# Patient Record
Sex: Female | Born: 1960 | Race: White | Hispanic: No | Marital: Single | Smoking: Current every day smoker
Health system: Southern US, Community
[De-identification: ages and names within clinical notes are randomized; demographics above are authoritative.]

## PROBLEM LIST (undated history)

## (undated) DIAGNOSIS — I10 Essential (primary) hypertension: Secondary | ICD-10-CM

## (undated) DIAGNOSIS — K759 Inflammatory liver disease, unspecified: Secondary | ICD-10-CM

## (undated) DIAGNOSIS — F419 Anxiety disorder, unspecified: Secondary | ICD-10-CM

## (undated) DIAGNOSIS — J449 Chronic obstructive pulmonary disease, unspecified: Secondary | ICD-10-CM

## (undated) HISTORY — PX: DILATION AND CURETTAGE OF UTERUS: SHX78

---

## 2004-12-04 ENCOUNTER — Emergency Department: Payer: Self-pay | Admitting: Emergency Medicine

## 2009-04-28 ENCOUNTER — Emergency Department: Payer: Self-pay | Admitting: Emergency Medicine

## 2012-12-06 ENCOUNTER — Emergency Department: Payer: Self-pay | Admitting: Emergency Medicine

## 2012-12-06 LAB — URINALYSIS, COMPLETE
Bacteria: NONE SEEN
Bilirubin,UR: NEGATIVE
Ketone: NEGATIVE
Leukocyte Esterase: NEGATIVE
Nitrite: NEGATIVE
Protein: NEGATIVE
RBC,UR: <1 /HPF (ref 0–5)
WBC UR: NONE SEEN /HPF (ref 0–5)

## 2013-07-13 ENCOUNTER — Emergency Department: Payer: Self-pay | Admitting: Emergency Medicine

## 2014-01-05 ENCOUNTER — Ambulatory Visit: Payer: Self-pay

## 2014-03-15 ENCOUNTER — Emergency Department: Payer: Self-pay | Admitting: Internal Medicine

## 2014-03-15 ENCOUNTER — Ambulatory Visit: Payer: Self-pay

## 2014-03-24 ENCOUNTER — Emergency Department: Payer: Self-pay | Admitting: Emergency Medicine

## 2014-08-31 ENCOUNTER — Emergency Department: Payer: Self-pay | Admitting: Emergency Medicine

## 2014-08-31 LAB — DRUG SCREEN, URINE
Amphetamines, Ur Screen: NEGATIVE (ref ?–1000)
Barbiturates, Ur Screen: NEGATIVE (ref ?–200)
Benzodiazepine, Ur Scrn: POSITIVE (ref ?–200)
CANNABINOID 50 NG, UR ~~LOC~~: NEGATIVE (ref ?–50)
Cocaine Metabolite,Ur ~~LOC~~: POSITIVE (ref ?–300)
MDMA (Ecstasy)Ur Screen: NEGATIVE (ref ?–500)
METHADONE, UR SCREEN: NEGATIVE (ref ?–300)
Opiate, Ur Screen: POSITIVE (ref ?–300)
PHENCYCLIDINE (PCP) UR S: NEGATIVE (ref ?–25)
TRICYCLIC, UR SCREEN: NEGATIVE (ref ?–1000)

## 2014-08-31 LAB — SALICYLATE LEVEL: Salicylates, Serum: 4 mg/dL — ABNORMAL HIGH

## 2014-08-31 LAB — ETHANOL: Ethanol: <3 mg/dL

## 2014-08-31 LAB — COMPREHENSIVE METABOLIC PANEL
ALBUMIN: 3.8 g/dL (ref 3.4–5.0)
ALT: 34 U/L
AST: 34 U/L (ref 15–37)
Alkaline Phosphatase: 79 U/L
Anion Gap: 8 (ref 7–16)
BUN: 6 mg/dL — ABNORMAL LOW (ref 7–18)
Bilirubin,Total: 0.5 mg/dL (ref 0.2–1.0)
CALCIUM: 8.4 mg/dL — AB (ref 8.5–10.1)
CHLORIDE: 106 mmol/L (ref 98–107)
Co2: 27 mmol/L (ref 21–32)
Creatinine: 0.7 mg/dL (ref 0.60–1.30)
Glucose: 114 mg/dL — ABNORMAL HIGH (ref 65–99)
OSMOLALITY: 280 (ref 275–301)
Potassium: 4 mmol/L (ref 3.5–5.1)
Sodium: 141 mmol/L (ref 136–145)
Total Protein: 8.2 g/dL (ref 6.4–8.2)

## 2014-08-31 LAB — CBC
HCT: 46.1 % (ref 35.0–47.0)
HGB: 15.6 g/dL (ref 12.0–16.0)
MCH: 31 pg (ref 26.0–34.0)
MCHC: 33.8 g/dL (ref 32.0–36.0)
MCV: 92 fL (ref 80–100)
PLATELETS: 334 10*3/uL (ref 150–440)
RBC: 5.03 10*6/uL (ref 3.80–5.20)
RDW: 12.1 % (ref 11.5–14.5)
WBC: 11.6 10*3/uL — AB (ref 3.6–11.0)

## 2014-08-31 LAB — TSH: Thyroid Stimulating Horm: 0.192 u[IU]/mL — ABNORMAL LOW

## 2014-08-31 LAB — ACETAMINOPHEN LEVEL: Acetaminophen: <2 ug/mL

## 2014-09-15 ENCOUNTER — Emergency Department: Payer: Self-pay | Admitting: Emergency Medicine

## 2014-10-30 ENCOUNTER — Emergency Department: Payer: Self-pay | Admitting: Emergency Medicine

## 2014-11-03 ENCOUNTER — Emergency Department: Payer: Self-pay | Admitting: Emergency Medicine

## 2015-04-30 ENCOUNTER — Emergency Department
Admission: EM | Admit: 2015-04-30 | Discharge: 2015-04-30 | Disposition: A | Discharge status: Home / Self Care | Payer: Self-pay | Attending: Emergency Medicine | Admitting: Emergency Medicine

## 2015-04-30 ENCOUNTER — Emergency Department: Payer: Self-pay

## 2015-04-30 DIAGNOSIS — M25511 Pain in right shoulder: Secondary | ICD-10-CM | POA: Insufficient documentation

## 2015-04-30 DIAGNOSIS — Z880 Allergy status to penicillin: Secondary | ICD-10-CM | POA: Insufficient documentation

## 2015-04-30 MED ORDER — IBUPROFEN 800 MG PO TABS
800.0000 mg | ORAL_TABLET | Freq: Once | ORAL | Status: AC
  Administered 2015-04-30: 800 mg via ORAL
  Filled 2015-04-30: qty 1

## 2015-04-30 MED ORDER — ACETAMINOPHEN 500 MG PO TABS
1000.0000 mg | ORAL_TABLET | Freq: Once | ORAL | Status: AC
  Administered 2015-04-30: 1000 mg via ORAL
  Filled 2015-04-30: qty 2

## 2015-04-30 NOTE — Discharge Instructions (Signed)
Arthralgia Arthralgia is joint pain. A joint is a place where two bones meet. Joint pain can happen for many reasons. The joint can be bruised, stiff, infected, or weak from aging. Pain usually goes away after resting and taking medicine for soreness.  HOME CARE  Rest the joint as told by your doctor.  Keep the sore joint raised (elevated) for the first 24 hours.  Put ice on the joint area.  Put ice in a plastic bag.  Place a towel between your skin and the bag.  Leave the ice on for 15-20 minutes, 03-04 times a day.  Wear your splint, casting, elastic bandage, or sling as told by your doctor.  Only take medicine as told by your doctor. Do not take aspirin.  Use crutches as told by your doctor. Do not put weight on the joint until told to by your doctor. GET HELP RIGHT AWAY IF:   You have bruising, puffiness (swelling), or more pain.  Your fingers or toes turn blue or start to lose feeling (numb).  Your medicine does not lessen the pain.  Your pain becomes severe.  You have a temperature by mouth above 102 F (38.9 C), not controlled by medicine.  You cannot move or use the joint. MAKE SURE YOU:   Understand these instructions.  Will watch your condition.  Will get help right away if you are not doing well or get worse. Document Released: 08/07/2009 Document Revised: 11/11/2011 Document Reviewed: 08/07/2009 [facility] Patient Information 2015 [place], [place]. This information is not intended to replace advice given to you by your health care provider. Make sure you discuss any questions you have with your health care provider.  Your shoulder looks okay at present. There may be a little bit of damage to the rotator cuff. But the x-rays show no fracture. Please use Tylenol for pain. Please follow-up with the orthopedic doctor either years at [facility]. Can try Dr. Hyacinth Meeker here in [place] return as needed

## 2015-04-30 NOTE — ED Notes (Signed)
PT STATES: [facility] HAS RIGHT SHOULDER IMAGES

## 2015-04-30 NOTE — ED Notes (Signed)
Pt noted to be laying curled up in a chair, laying on her right side, in the lobby; eyes closed, resp even and nonlabored

## 2015-04-30 NOTE — ED Notes (Addendum)
Pt says she's been having pain to her right shoulder since March; says she had fallen but didn't realize she had broken her shoulder; pt says she is to have a procedure at [facility] in September to "rebreak" the shoulder; pt very talkative in triage

## 2015-04-30 NOTE — ED Notes (Signed)
Patient transported to X-ray 

## 2015-04-30 NOTE — ED Provider Notes (Signed)
[facility] Emergency Department Provider Note  ____________________________________________  Time seen: Approximately 4:11 AM  I have reviewed the triage vital signs and the nursing notes.   HISTORY  Chief Complaint Shoulder Pain    HPI Cassandra Jennings is a 54 y.o. female patient complains of approximately one month pain in the right shoulder. Patient apparently thought she had fractured it. She called [facility] and they "scared her". X-ray here shows no fracture. Patient moves her shoulder well. There is no swelling or deformity. I can see. Sensation says her shoulder works okay. He is asking for something stronger than Tylenol. I explained to her that I cannot really give her anything more than Tylenol or Motrin at present and certainly no prescription for anything stronger than that for pain it's been going on for over a month. She seems to be accepting of that. I should add that there is no limitation of range of motion and that there is no radiation of the pain there is no color change in the hand or history of any of the above.   No past medical history on file. patient reports she has asthma  There are no active problems to display for this patient.   No past surgical history on file.  No current outpatient prescriptions on file.  Allergies Penicillins  No family history on file.  Social History Social History  Substance Use Topics  . Smoking status: Not on file  . Smokeless tobacco: Not on file  . Alcohol Use: Not on file   patient smokes  Review of Systems Constitutional: No fever/chills Eyes: No visual changes. ENT: No sore throat.  Genitourinary: Negative for dysuria. Musculoskeletal: Negative for back pain. Skin: Negative for rash. Neurological: Negative for headaches, focal weakness or numbness.   10-point ROS otherwise negative.  ____________________________________________   PHYSICAL EXAM:  VITAL SIGNS: ED Triage Vitals   Enc Vitals Group     BP 04/30/15 0056 152/83 mmHg     Pulse Rate 04/30/15 0056 55     Resp 04/30/15 0056 18     Temp 04/30/15 0056 97.6 F (36.4 C)     Temp src --      SpO2 04/30/15 0056 99 %     Weight 04/30/15 0056 108 lb (48.988 kg)     Height 04/30/15 0056 5\' 3"  (1.6 m)     Head Cir --      Peak Flow --      Pain Score 04/30/15 0058 7     Pain Loc --      Pain Edu? --      Excl. in GC? --    Constitutional: Alert and oriented. Well appearing and in no acute distress. Eyes: Conjunctivae are normal. PERRL. EOMI. Head: Atraumatic. Mouth/Throat: Mucous membranes are moist.  Oropharynx non-erythematous. Neck: No stridor.Cardiovascular: Normal rate, regular rhythm. Grossly normal heart sounds.  Good peripheral circulation. Musculoskeletal: No lower extremity tenderness nor edema.  No joint effusions up her extremities normal equal pulses normal capillary refill the right shoulder is not tender to palpation there is no effusion there is full range of motion of the shoulder and good strength Neurologic:  Normal speech and language. No gross focal neurologic deficits are appreciated. Skin:  Skin is warm, dry and intact. No rash noted. Psychiatric: Mood and affect are normal. Speech and behavior are normal.  ____________________________________________   LABS (all labs ordered are listed, but only abnormal results are displayed)  Labs Reviewed - No data to  display ____________________________________________  EKG   ____________________________________________  RADIOLOGY  X-ray read by radiology as no fracture no acute disease or may be some bony changes consistent with rotator cuff injury in the computer will not allow me to view the x-rays ____________________________________________   PROCEDURES   ____________________________________________   INITIAL IMPRESSION / ASSESSMENT AND PLAN / ED COURSE  Pertinent labs & imaging results that were available during my care  of the patient were reviewed by me and considered in my medical decision making (see chart for details).   ____________________________________________   FINAL CLINICAL IMPRESSION(S) / ED DIAGNOSES  Final diagnoses:  Shoulder pain, right      Arnaldo Natal, MD 04/30/15 [phone]

## 2015-08-12 ENCOUNTER — Emergency Department
Admission: EM | Admit: 2015-08-12 | Discharge: 2015-08-12 | Disposition: A | Discharge status: Home / Self Care | Payer: Self-pay | Attending: Emergency Medicine | Admitting: Emergency Medicine

## 2015-08-12 ENCOUNTER — Encounter: Payer: Self-pay | Admitting: Emergency Medicine

## 2015-08-12 DIAGNOSIS — F17200 Nicotine dependence, unspecified, uncomplicated: Secondary | ICD-10-CM | POA: Insufficient documentation

## 2015-08-12 DIAGNOSIS — I10 Essential (primary) hypertension: Secondary | ICD-10-CM | POA: Insufficient documentation

## 2015-08-12 DIAGNOSIS — L03115 Cellulitis of right lower limb: Secondary | ICD-10-CM | POA: Insufficient documentation

## 2015-08-12 DIAGNOSIS — Z880 Allergy status to penicillin: Secondary | ICD-10-CM | POA: Insufficient documentation

## 2015-08-12 HISTORY — DX: Anxiety disorder, unspecified: F41.9

## 2015-08-12 HISTORY — DX: Chronic obstructive pulmonary disease, unspecified: J44.9

## 2015-08-12 HISTORY — DX: Essential (primary) hypertension: I10

## 2015-08-12 MED ORDER — TRAMADOL HCL 50 MG PO TABS
50.0000 mg | ORAL_TABLET | Freq: Once | ORAL | Status: AC
  Administered 2015-08-12: 50 mg via ORAL
  Filled 2015-08-12: qty 1

## 2015-08-12 MED ORDER — SULFAMETHOXAZOLE-TRIMETHOPRIM 800-160 MG PO TABS: 2.0000 | ORAL_TABLET | Freq: Two times a day (BID) | ORAL | Status: DC

## 2015-08-12 MED ORDER — SULFAMETHOXAZOLE-TRIMETHOPRIM 800-160 MG PO TABS
1.0000 | ORAL_TABLET | Freq: Once | ORAL | Status: AC
  Administered 2015-08-12: 1 via ORAL
  Filled 2015-08-12: qty 1

## 2015-08-12 NOTE — ED Notes (Signed)
Pt reports spider bite to RLE.  Redness noted.  Pt states "i tried to put a piece of apple on it."

## 2015-08-12 NOTE — ED Notes (Signed)
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

## 2015-08-12 NOTE — Discharge Instructions (Signed)
Cellulitis Cellulitis is an infection of the skin and the tissue beneath it. The infected area is usually red and tender. Cellulitis occurs most often in the arms and lower legs.  CAUSES  Cellulitis is caused by bacteria that enter the skin through cracks or cuts in the skin. The most common types of bacteria that cause cellulitis are staphylococci and streptococci. SIGNS AND SYMPTOMS   Redness and warmth.  Swelling.  Tenderness or pain.  Fever. DIAGNOSIS  Your health care provider can usually determine what is wrong based on a physical exam. Blood tests may also be done. TREATMENT  Treatment usually involves taking an antibiotic medicine. HOME CARE INSTRUCTIONS   Take your antibiotic medicine as directed by your health care provider. Finish the antibiotic even if you start to feel better.  Keep the infected arm or leg elevated to reduce swelling.  Apply a warm cloth to the affected area up to 4 times per day to relieve pain.  Take medicines only as directed by your health care provider.  Keep all follow-up visits as directed by your health care provider. SEEK MEDICAL CARE IF:   You notice red streaks coming from the infected area.  Your red area gets larger or turns dark in color.  Your bone or joint underneath the infected area becomes painful after the skin has healed.  Your infection returns in the same area or another area.  You notice a swollen bump in the infected area.  You develop new symptoms.  You have a fever. SEEK IMMEDIATE MEDICAL CARE IF:   You feel very sleepy.  You develop vomiting or diarrhea.  You have a general ill feeling (malaise) with muscle aches and pains.   This information is not intended to replace advice given to you by your health care provider. Make sure you discuss any questions you have with your health care provider.   Document Released: 05/29/2005 Document Revised: 05/10/2015 Document Reviewed: 11/04/2011 Elsevier Interactive  Patient Education 2016 [employer]  Rest with the leg elevated. Apply warm compresses to promote healing. Take the antibiotic as directed until completed.  [facility].

## 2015-08-12 NOTE — ED Provider Notes (Signed)
[facility] Emergency Department Provider Note ____________________________________________  Time seen: 2036  I have reviewed the triage vital signs and the nursing notes.  HISTORY  Chief Complaint  "spider bite"   HPI Cassandra Jennings is a 54 y.o. female ports to the ED for evaluation of a bump to the right lower extremity from about 3 days ago. She also notes some migration to the area as well. She is under the impression she may have had an insect bite to the area. She does report a remote history of MRSA to the scalp about a year prior. She denies any fevers, chills, or sweats.  Past Medical History  Diagnosis Date  . COPD (chronic obstructive pulmonary disease) (HCC)   . Anxiety   . Hypertension    There are no active problems to display for this patient.  History reviewed. No pertinent past surgical history.  Current Outpatient Rx  Name  Route  Sig  Dispense  Refill  . sulfamethoxazole-trimethoprim (BACTRIM DS,SEPTRA DS) 800-160 MG tablet   Oral   Take 2 tablets by mouth 2 (two) times daily.   40 tablet   0    Allergies Penicillins  History reviewed. No pertinent family history.  Social History Social History  Substance Use Topics  . Smoking status: Current Every Day Smoker  . Smokeless tobacco: None  . Alcohol Use: No   Review of Systems  Constitutional: Negative for fever. Eyes: Negative for visual changes. ENT: Negative for sore throat. Cardiovascular: Negative for chest pain. Respiratory: Negative for shortness of breath. Gastrointestinal: Negative for abdominal pain, vomiting and diarrhea. Genitourinary: Negative for dysuria. Musculoskeletal: Negative for back pain. Skin: Negative for rash. Right lotion any lesion as above. Neurological: Negative for headaches, focal weakness or numbness. ____________________________________________  PHYSICAL EXAM:  VITAL SIGNS: ED Triage Vitals  Enc Vitals Group     BP 08/12/15 1959  139/65 mmHg     Pulse Rate 08/12/15 1959 70     Resp 08/12/15 1959 18     Temp 08/12/15 1959 98 F (36.7 C)     Temp Source 08/12/15 1959 Oral     SpO2 08/12/15 1959 98 %     Weight 08/12/15 1959 113 lb (51.256 kg)     Height 08/12/15 1959 5\' 2"  (1.575 m)     Head Cir --      Peak Flow --      Pain Score 08/12/15 1957 8     Pain Loc --      Pain Edu? --      Excl. in GC? --    Constitutional: Alert and oriented. Well appearing and in no distress. Head: Normocephalic and atraumatic.      Eyes: Conjunctivae are normal. PERRL. Normal extraocular movements      Ears: Canals clear. TMs intact bilaterally.   Nose: No congestion/rhinorrhea.   Mouth/Throat: Mucous membranes are moist.   Neck: Supple. No thyromegaly. Hematological/Lymphatic/Immunological: No cervical lymphadenopathy. Cardiovascular: Normal rate, regular rhythm.  Respiratory: Normal respiratory effort. No wheezes/rales/rhonchi. Gastrointestinal: Soft and nontender. No distention. Musculoskeletal: Nontender with normal range of motion in all extremities.  Neurologic:  Normal gait without ataxia. Normal speech and language. No gross focal neurologic deficits are appreciated. Skin:  Skin is warm, dry and intact. No rash noted. Right anterior shin with a well-demarcated area of erythema with a central scab noted. No lymphangitis, phlebitis or purulent drainage is appreciated. Psychiatric: Mood and affect are normal. Patient exhibits appropriate insight and judgment. ____________________________________________  PROCEDURES  Bactrim DS PO Ultram 50 mg PO ____________________________________________  INITIAL IMPRESSION / ASSESSMENT AND PLAN / ED COURSE  Patient with a right lower extremity superficial nonpurulent cellulitis. She will be discharged with a prescription for Bactrim to dose as directed. She is also to rest with the leg elevated and apply warm compresses to promote healing. She'll follow-up with her  primary care provider at [facility] as needed. Return to the ED for acutely worsening symptoms. ____________________________________________  FINAL CLINICAL IMPRESSION(S) / ED DIAGNOSES  Final diagnoses:  Cellulitis of right lower extremity       Lissa Hoard, PA-C 08/12/15 1610  Jennye Moccasin, MD 08/12/15 2219

## 2015-08-12 NOTE — ED Notes (Signed)
Pt asked if she could have a percocet to take to "feel good for a while." Pt told she is being given a tramadol for pain. Pt appears to be very sleepy; denies any alcohol or drug use today.

## 2015-08-12 NOTE — ED Notes (Signed)
Pt. States she first noticed bump to RLE three days ago.  Pt. Has red swollen bump to posterior of RLE.

## 2015-08-12 NOTE — ED Notes (Addendum)
Pt discharged home after verbalizing understanding of discharge instructions; nad noted. Pt has a ride home.

## 2015-12-04 ENCOUNTER — Emergency Department
Admission: EM | Admit: 2015-12-04 | Discharge: 2015-12-04 | Disposition: A | Discharge status: Home / Self Care | Payer: Self-pay | Attending: Emergency Medicine | Admitting: Emergency Medicine

## 2015-12-04 DIAGNOSIS — I10 Essential (primary) hypertension: Secondary | ICD-10-CM | POA: Insufficient documentation

## 2015-12-04 DIAGNOSIS — R52 Pain, unspecified: Secondary | ICD-10-CM | POA: Insufficient documentation

## 2015-12-04 DIAGNOSIS — J449 Chronic obstructive pulmonary disease, unspecified: Secondary | ICD-10-CM | POA: Insufficient documentation

## 2015-12-04 DIAGNOSIS — F17200 Nicotine dependence, unspecified, uncomplicated: Secondary | ICD-10-CM | POA: Insufficient documentation

## 2015-12-04 DIAGNOSIS — H66001 Acute suppurative otitis media without spontaneous rupture of ear drum, right ear: Secondary | ICD-10-CM | POA: Insufficient documentation

## 2015-12-04 DIAGNOSIS — F419 Anxiety disorder, unspecified: Secondary | ICD-10-CM | POA: Insufficient documentation

## 2015-12-04 DIAGNOSIS — M19049 Primary osteoarthritis, unspecified hand: Secondary | ICD-10-CM | POA: Insufficient documentation

## 2015-12-04 MED ORDER — SULFAMETHOXAZOLE-TRIMETHOPRIM 800-160 MG PO TABS
1.0000 | ORAL_TABLET | Freq: Two times a day (BID) | ORAL | Status: DC
  Administered 2015-12-04: 1 via ORAL

## 2015-12-04 MED ORDER — IBUPROFEN 600 MG PO TABS: 600.0000 mg | ORAL_TABLET | Freq: Four times a day (QID) | ORAL | Status: DC | PRN

## 2015-12-04 MED ORDER — IBUPROFEN 600 MG PO TABS
600.0000 mg | ORAL_TABLET | Freq: Once | ORAL | Status: AC
  Administered 2015-12-04: 600 mg via ORAL
  Filled 2015-12-04: qty 1

## 2015-12-04 MED ORDER — SULFAMETHOXAZOLE-TRIMETHOPRIM 800-160 MG PO TABS
ORAL_TABLET | ORAL | Status: AC
  Filled 2015-12-04: qty 1

## 2015-12-04 MED ORDER — SULFAMETHOXAZOLE-TRIMETHOPRIM 800-160 MG PO TABS: 1.0000 | ORAL_TABLET | Freq: Two times a day (BID) | ORAL | Status: DC

## 2015-12-04 NOTE — Discharge Instructions (Signed)

## 2015-12-04 NOTE — ED Provider Notes (Signed)
CSN: [id number]     Arrival date & time 12/04/15  1648 History   First MD Initiated Contact with Patient 12/04/15 1844     Chief Complaint  Patient presents with  . Generalized Body Aches  . Otalgia    HPI  55 year old female who presents to the emergency department for evaluation of right ear pain. She reports it started approximately one month ago, but she has not had any money or a way to come to the emergency department. She is also complaining of arthritis in her hands. She states that she has taken some children's Tylenol "whenever I can get some" with little relief.  Past Medical History  Diagnosis Date  . COPD (chronic obstructive pulmonary disease) (HCC)   . Anxiety   . Hypertension    History reviewed. No pertinent past surgical history. No family history on file. Social History  Substance Use Topics  . Smoking status: Current Every Day Smoker  . Smokeless tobacco: None  . Alcohol Use: No   OB History    No data available     Review of Systems  Constitutional: Negative for fever and chills.  HENT: Positive for ear pain. Negative for ear discharge, hearing loss and sore throat.   Respiratory: Positive for cough.   Musculoskeletal: Positive for myalgias.  Skin: Negative.   Neurological: Negative for light-headedness and headaches.      Allergies  Penicillins  Home Medications   Prior to Admission medications   Medication Sig Start Date End Date Taking? Authorizing Provider  ibuprofen (ADVIL,MOTRIN) 600 MG tablet Take 1 tablet (600 mg total) by mouth every 6 (six) hours as needed. 12/04/15   Chinita Pester, FNP  sulfamethoxazole-trimethoprim (BACTRIM DS,SEPTRA DS) 800-160 MG tablet Take 1 tablet by mouth 2 (two) times daily. 12/04/15   Cleotis Sparr B Kendan Cornforth, FNP   BP 127/58 mmHg  Pulse 65  Temp(Src) 98.4 F (36.9 C) (Oral)  Resp 16  Ht  (1.626 m)  Wt 52.164 kg  BMI 19.73 kg/m2  SpO2 98% Physical Exam  Constitutional: She is oriented to person, place, and  time. She appears well-developed. No distress.  HENT:  Right Ear: External ear and ear canal normal. Tympanic membrane is scarred and erythematous.  Left Ear: Tympanic membrane and external ear normal.  Nose: Nose normal.  Mouth/Throat: Uvula is midline and mucous membranes are normal. Oropharyngeal exudate present.  Eyes: EOM are normal.  Neck: Normal range of motion.  Cardiovascular: Normal rate.   Pulmonary/Chest: Effort normal and breath sounds normal.  Musculoskeletal: Normal range of motion.  Neurological: She is alert and oriented to person, place, and time.  Skin: Skin is warm and dry.  Psychiatric: Her behavior is normal. Thought content normal.  Nursing note and vitals reviewed.   ED Course  Procedures (including critical care time) Labs Review Labs Reviewed - No data to display  Imaging Review No results found. I have personally reviewed and evaluated these images and lab results as part of my medical decision-making.   EKG Interpretation None      MDM   Final diagnoses:  Acute suppurative otitis media of right ear without spontaneous rupture of tympanic membrane, recurrence not specified    Patient will be prescribed Bactrim since it is on the $4 list at [facility] and she has a penicillin allergy. She will also be given a prescription for ibuprofen. She was encouraged to [facility] or [facility] to see if she qualifies for their free  clinic. She was instructed to return to the emergency department for symptoms that change or worsen if she is unable to schedule an appointment.    Chinita PesterCari B Pachia Strum, FNP 12/04/15 2038  Phineas SemenGraydon Goodman, MD 12/04/15 2049

## 2015-12-04 NOTE — ED Notes (Signed)
Pt c/o bilateral earache, body aches. Pt not feeling well X 1 month. Pt alert and oriented X4, active, cooperative, pt in NAD. RR even and unlabored, color WNL.

## 2015-12-04 NOTE — ED Notes (Signed)
AAOx3.  Skin warm and dry.  NAD 

## 2015-12-27 ENCOUNTER — Encounter: Payer: Self-pay | Admitting: Emergency Medicine

## 2015-12-27 ENCOUNTER — Emergency Department: Payer: Self-pay

## 2015-12-27 ENCOUNTER — Emergency Department
Admission: EM | Admit: 2015-12-27 | Discharge: 2015-12-27 | Disposition: A | Discharge status: Home / Self Care | Payer: Self-pay | Attending: Emergency Medicine | Admitting: Emergency Medicine

## 2015-12-27 DIAGNOSIS — R059 Cough, unspecified: Secondary | ICD-10-CM

## 2015-12-27 DIAGNOSIS — J449 Chronic obstructive pulmonary disease, unspecified: Secondary | ICD-10-CM | POA: Insufficient documentation

## 2015-12-27 DIAGNOSIS — F17200 Nicotine dependence, unspecified, uncomplicated: Secondary | ICD-10-CM | POA: Insufficient documentation

## 2015-12-27 DIAGNOSIS — I10 Essential (primary) hypertension: Secondary | ICD-10-CM | POA: Insufficient documentation

## 2015-12-27 DIAGNOSIS — M791 Myalgia: Secondary | ICD-10-CM | POA: Insufficient documentation

## 2015-12-27 DIAGNOSIS — Z79899 Other long term (current) drug therapy: Secondary | ICD-10-CM | POA: Insufficient documentation

## 2015-12-27 DIAGNOSIS — G8929 Other chronic pain: Secondary | ICD-10-CM | POA: Insufficient documentation

## 2015-12-27 DIAGNOSIS — R05 Cough: Secondary | ICD-10-CM

## 2015-12-27 NOTE — ED Provider Notes (Signed)
[facility] Emergency Department Provider Note  ____________________________________________  Time seen: 9:10 AM  I have reviewed the triage vital signs and the nursing notes.   HISTORY  Chief Complaint Cough  Level 5 caveat:  Portions of the history and physical were unable to be obtained due to the patient's being a poor historian   HPI Cassandra Jennings is a 55 y.o. female who complains of cough for the past week. Nonproductive. At rest she has no chest pain shortness of breath. She thinks she might have had a fever about a week ago but did not measure it, and has not felt that since then. No vision changes numbness [place] weakness. She reports when she wakes up in the morning she has some low back pain that is nonradiating, and when she gets up and starts moving the pain gets better. Also complains of chronic pain in the hands when she wakes up in the morning until she exercises her finger range of motion.  She was seen in the emergency department about 3 weeks ago for ear ache. She was prescribed Bactrim. She reports that she took this intermittently but eventually did finish the course and that her earache has resolved.  She also requests copies of her medical records and produces a mailing she received from the [employer] indicating that she has an upcoming hearing related to her application for some sort of benefits. This seems to be the main purpose of her visit today.     Past Medical History  Diagnosis Date  . COPD (chronic obstructive pulmonary disease) (HCC)   . Anxiety   . Hypertension      There are no active problems to display for this patient.    History reviewed. No pertinent past surgical history.   Current Outpatient Rx  Name  Route  Sig  Dispense  Refill  . ibuprofen (ADVIL,MOTRIN) 600 MG tablet   Oral   Take 1 tablet (600 mg total) by mouth every 6 (six) hours as needed.   30 tablet   0   .  sulfamethoxazole-trimethoprim (BACTRIM DS,SEPTRA DS) 800-160 MG tablet   Oral   Take 1 tablet by mouth 2 (two) times daily.   20 tablet   0      Allergies Penicillins   No family history on file.  Social History Social History  Substance Use Topics  . Smoking status: Current Every Day Smoker  . Smokeless tobacco: None  . Alcohol Use: No    Review of Systems  Constitutional:   No fever or chills.  Eyes:   No vision changes.  ENT:   No sore throat. No rhinorrhea. Cardiovascular:   No chest pain. Respiratory:   No dyspnea Positive nonproductive cough. Gastrointestinal:   Negative for abdominal pain, vomiting and diarrhea.  Genitourinary:   Negative for dysuria or difficulty urinating. Musculoskeletal:   Chronic low back pain, chronic hand stiffness Neurological:   Negative for headaches 10-point ROS otherwise negative.  ____________________________________________   PHYSICAL EXAM:  VITAL SIGNS: ED Triage Vitals  Enc Vitals Group     BP 12/27/15 0859 146/79 mmHg     Pulse Rate 12/27/15 0859 72     Resp 12/27/15 0859 20     Temp 12/27/15 0859 98.3 F (36.8 C)     Temp Source 12/27/15 0859 Oral     SpO2 12/27/15 0859 95 %     Weight 12/27/15 0859 113 lb (51.256 kg)     Height 12/27/15  0859  (1.575 m)     Head Cir --      Peak Flow --      Pain Score 12/27/15 0900 10     Pain Loc --      Pain Edu? --      Excl. in GC? --     Vital signs reviewed, nursing assessments reviewed.   Constitutional:   Alert and oriented. Well appearing and in no distress. Eyes:   No scleral icterus. No conjunctival pallor. PERRL. EOMI.  No nystagmus. ENT   Head:   Normocephalic and atraumatic.TMs normal bilaterally   Nose:   Positive rhinorrhea. No septal hematoma   Mouth/Throat:   MMM, no pharyngeal erythema. No peritonsillar mass.    Neck:   No stridor. No SubQ emphysema. No meningismus. Hematological/Lymphatic/Immunilogical:   No cervical  lymphadenopathy. Cardiovascular:   RRR. Symmetric bilateral radial and DP pulses.  No murmurs.  Respiratory:   Normal respiratory effort without tachypnea nor retractions. Breath sounds are clear and equal bilaterally. No wheezes/rales/rhonchi. Gastrointestinal:   Soft and nontender. Non distended. There is no CVA tenderness.  No rebound, rigidity, or guarding. Genitourinary:   deferred Musculoskeletal:   Nontender with normal range of motion in all extremities. No joint effusions.  No lower extremity tenderness.  No edema. Neurologic:   Normal speech and language.  CN 2-10 normal. Motor grossly intact. Steady well coordinated gait, stands and changes into examination gown fluidly and without difficulty. No gross focal neurologic deficits are appreciated.  Skin:    Skin is warm, dry and intact. No rash noted.  No petechiae, purpura, or bullae.  ____________________________________________    LABS (pertinent positives/negatives) (all labs ordered are listed, but only abnormal results are displayed) Labs Reviewed - No data to display ____________________________________________   EKG  Interpreted by me Normal sinus rhythm rate of 64, normal axis and intervals, normal QRS ST segments and T waves.  ____________________________________________    RADIOLOGY  Chest x-ray unremarkable  ____________________________________________   PROCEDURES   ____________________________________________   INITIAL IMPRESSION / ASSESSMENT AND PLAN / ED COURSE  Pertinent labs & imaging results that were available during my care of the patient were reviewed by me and considered in my medical decision making (see chart for details).  Patient well appearing no acute distress. Very calm and comfortable. Her only acute complaint today is a nonproductive cough. She reports this is been going on for 1 week. She also had a cough reported during her examination at the beginning of this month, and I  suspect that it is not new. With the rhinorrhea, it may be upper respiratory infection versus seasonal allergies. Exam is otherwise unremarkable and reassuring. No evidence of any acute bacterial illness, low suspicion for pneumothorax or pulmonary embolism. With her history of COPD, all check a chest x-ray. Patient informed that we are unable to provide her copies of medical records in the emergency department. Patient is encouraged to follow up with primary care as soon as possible for ongoing management of her chronic medical issues. Unless there are significant findings on x-ray, the patient is medically stable and does not require any new prescriptions or interventions at this time.   ----------------------------------------- 10:27 AM on 12/27/2015 -----------------------------------------  Chest x-ray negative. We'll discharge to follow up with primary care.    ____________________________________________   FINAL CLINICAL IMPRESSION(S) / ED DIAGNOSES  Final diagnoses:  Cough  Chronic musculoskeletal pain     Portions of this note were generated with dragon  dictation software. Dictation errors may occur despite best attempts at proofreading.   Sharman Cheek, MD 12/27/15 1027

## 2015-12-27 NOTE — ED Notes (Signed)
Patient transported to X-ray 

## 2015-12-27 NOTE — Discharge Instructions (Signed)

## 2015-12-27 NOTE — ED Notes (Signed)
Patient to ER for c/o cough x1 month. States she is having back and chest pain after coughing. Patient tearful in triage.

## 2015-12-27 NOTE — ED Notes (Signed)
MD at bedside. Dr.Stafford

## 2016-05-21 ENCOUNTER — Telehealth: Payer: Self-pay

## 2016-05-21 NOTE — Telephone Encounter (Signed)
Called pt to go over eligibility requirements. PT verbalized understanding.

## 2016-06-04 ENCOUNTER — Emergency Department: Payer: Medicaid Other

## 2016-06-04 ENCOUNTER — Emergency Department
Admission: EM | Admit: 2016-06-04 | Discharge: 2016-06-04 | Disposition: A | Discharge status: Home / Self Care | Payer: Medicaid Other | Attending: Emergency Medicine | Admitting: Emergency Medicine

## 2016-06-04 DIAGNOSIS — N760 Acute vaginitis: Secondary | ICD-10-CM | POA: Insufficient documentation

## 2016-06-04 DIAGNOSIS — R102 Pelvic and perineal pain: Secondary | ICD-10-CM | POA: Diagnosis present

## 2016-06-04 DIAGNOSIS — J449 Chronic obstructive pulmonary disease, unspecified: Secondary | ICD-10-CM | POA: Insufficient documentation

## 2016-06-04 DIAGNOSIS — I10 Essential (primary) hypertension: Secondary | ICD-10-CM | POA: Diagnosis not present

## 2016-06-04 DIAGNOSIS — F17200 Nicotine dependence, unspecified, uncomplicated: Secondary | ICD-10-CM | POA: Diagnosis not present

## 2016-06-04 DIAGNOSIS — B9689 Other specified bacterial agents as the cause of diseases classified elsewhere: Secondary | ICD-10-CM

## 2016-06-04 DIAGNOSIS — Z791 Long term (current) use of non-steroidal anti-inflammatories (NSAID): Secondary | ICD-10-CM | POA: Insufficient documentation

## 2016-06-04 LAB — CBC WITH DIFFERENTIAL/PLATELET
Basophils Absolute: 0.2 10*3/uL — ABNORMAL HIGH (ref 0–0.1)
Basophils Relative: 3 %
EOS ABS: 0.2 10*3/uL (ref 0–0.7)
Eosinophils Relative: 3 %
HCT: 44.1 % (ref 35.0–47.0)
HEMOGLOBIN: 15.4 g/dL (ref 12.0–16.0)
LYMPHS ABS: 2.1 10*3/uL (ref 1.0–3.6)
LYMPHS PCT: 28 %
MCH: 30.6 pg (ref 26.0–34.0)
MCHC: 34.8 g/dL (ref 32.0–36.0)
MCV: 87.8 fL (ref 80.0–100.0)
MONOS PCT: 7 %
Monocytes Absolute: 0.5 10*3/uL (ref 0.2–0.9)
NEUTROS PCT: 59 %
Neutro Abs: 4.4 10*3/uL (ref 1.4–6.5)
Platelets: 316 10*3/uL (ref 150–440)
RBC: 5.02 MIL/uL (ref 3.80–5.20)
RDW: 12.9 % (ref 11.5–14.5)
WBC: 7.5 10*3/uL (ref 3.6–11.0)

## 2016-06-04 LAB — URINALYSIS COMPLETE WITH MICROSCOPIC (ARMC ONLY)
BACTERIA UA: NONE SEEN
Bilirubin Urine: NEGATIVE
Glucose, UA: NEGATIVE mg/dL
Hgb urine dipstick: NEGATIVE
Ketones, ur: NEGATIVE mg/dL
LEUKOCYTES UA: NEGATIVE
NITRITE: NEGATIVE
PROTEIN: NEGATIVE mg/dL
SPECIFIC GRAVITY, URINE: 1.014 (ref 1.005–1.030)
pH: 6 (ref 5.0–8.0)

## 2016-06-04 LAB — COMPREHENSIVE METABOLIC PANEL
ALK PHOS: 61 U/L (ref 38–126)
ALT: 17 U/L (ref 14–54)
ANION GAP: 8 (ref 5–15)
AST: 26 U/L (ref 15–41)
Albumin: 4 g/dL (ref 3.5–5.0)
BILIRUBIN TOTAL: 0.6 mg/dL (ref 0.3–1.2)
BUN: 7 mg/dL (ref 6–20)
CALCIUM: 9.2 mg/dL (ref 8.9–10.3)
CO2: 29 mmol/L (ref 22–32)
CREATININE: 0.64 mg/dL (ref 0.44–1.00)
Chloride: 105 mmol/L (ref 101–111)
Glucose, Bld: 74 mg/dL (ref 65–99)
Potassium: 3.5 mmol/L (ref 3.5–5.1)
SODIUM: 142 mmol/L (ref 135–145)
TOTAL PROTEIN: 7.8 g/dL (ref 6.5–8.1)

## 2016-06-04 LAB — CHLAMYDIA/NGC RT PCR (ARMC ONLY)
Chlamydia Tr: NOT DETECTED
N GONORRHOEAE: NOT DETECTED

## 2016-06-04 LAB — RAPID HIV SCREEN (HIV 1/2 AB+AG)
HIV 1/2 Antibodies: NONREACTIVE
HIV-1 P24 Antigen - HIV24: NONREACTIVE

## 2016-06-04 LAB — WET PREP, GENITAL
Sperm: NONE SEEN
Trich, Wet Prep: NONE SEEN
YEAST WET PREP: NONE SEEN

## 2016-06-04 LAB — POCT PREGNANCY, URINE: PREG TEST UR: NEGATIVE

## 2016-06-04 MED ORDER — AZITHROMYCIN 500 MG PO TABS: 2000.0000 mg | ORAL_TABLET | Freq: Once | ORAL | Status: DC

## 2016-06-04 MED ORDER — METRONIDAZOLE 500 MG PO TABS
500.0000 mg | ORAL_TABLET | Freq: Once | ORAL | Status: AC
  Administered 2016-06-04: 500 mg via ORAL
  Filled 2016-06-04: qty 1

## 2016-06-04 MED ORDER — LEVOFLOXACIN 500 MG PO TABS: 500.0000 mg | ORAL_TABLET | Freq: Once | ORAL | Status: DC

## 2016-06-04 MED ORDER — KETOROLAC TROMETHAMINE 30 MG/ML IJ SOLN
30.0000 mg | Freq: Once | INTRAMUSCULAR | Status: AC
  Administered 2016-06-04: 30 mg via INTRAVENOUS
  Filled 2016-06-04: qty 1

## 2016-06-04 MED ORDER — METRONIDAZOLE 500 MG PO TABS: 500.0000 mg | ORAL_TABLET | Freq: Three times a day (TID) | ORAL | 0 refills | Status: AC
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
## 2016-06-05 LAB — RPR: RPR Ser Ql: NONREACTIVE

## 2016-12-10 ENCOUNTER — Encounter: Payer: Self-pay | Admitting: Emergency Medicine

## 2016-12-10 ENCOUNTER — Emergency Department: Payer: Medicaid Other

## 2016-12-10 ENCOUNTER — Emergency Department
Admission: EM | Admit: 2016-12-10 | Discharge: 2016-12-10 | Disposition: A | Discharge status: Home / Self Care | Payer: Medicaid Other | Attending: Student in an Organized Health Care Education/Training Program | Admitting: Student in an Organized Health Care Education/Training Program

## 2016-12-10 DIAGNOSIS — I10 Essential (primary) hypertension: Secondary | ICD-10-CM | POA: Diagnosis not present

## 2016-12-10 DIAGNOSIS — J449 Chronic obstructive pulmonary disease, unspecified: Secondary | ICD-10-CM | POA: Diagnosis not present

## 2016-12-10 DIAGNOSIS — R1013 Epigastric pain: Secondary | ICD-10-CM

## 2016-12-10 DIAGNOSIS — R519 Headache, unspecified: Secondary | ICD-10-CM

## 2016-12-10 DIAGNOSIS — N390 Urinary tract infection, site not specified: Secondary | ICD-10-CM | POA: Insufficient documentation

## 2016-12-10 DIAGNOSIS — R1032 Left lower quadrant pain: Secondary | ICD-10-CM | POA: Diagnosis present

## 2016-12-10 DIAGNOSIS — Z791 Long term (current) use of non-steroidal anti-inflammatories (NSAID): Secondary | ICD-10-CM | POA: Insufficient documentation

## 2016-12-10 DIAGNOSIS — R6884 Jaw pain: Secondary | ICD-10-CM | POA: Diagnosis not present

## 2016-12-10 DIAGNOSIS — R51 Headache: Secondary | ICD-10-CM | POA: Diagnosis not present

## 2016-12-10 DIAGNOSIS — F17200 Nicotine dependence, unspecified, uncomplicated: Secondary | ICD-10-CM | POA: Diagnosis not present

## 2016-12-10 LAB — BASIC METABOLIC PANEL
Anion gap: 7 (ref 5–15)
BUN: 10 mg/dL (ref 6–20)
CALCIUM: 8.7 mg/dL — AB (ref 8.9–10.3)
CO2: 27 mmol/L (ref 22–32)
CREATININE: 0.8 mg/dL (ref 0.44–1.00)
Chloride: 106 mmol/L (ref 101–111)
GFR calc Af Amer: >60 mL/min (ref >60)
GLUCOSE: 101 mg/dL — AB (ref 65–99)
POTASSIUM: 3.4 mmol/L — AB (ref 3.5–5.1)
SODIUM: 140 mmol/L (ref 135–145)

## 2016-12-10 LAB — CBC WITH DIFFERENTIAL/PLATELET
BASOS ABS: 0.1 10*3/uL (ref 0–0.1)
Basophils Relative: 1 %
EOS PCT: 2 %
Eosinophils Absolute: 0.2 10*3/uL (ref 0–0.7)
HCT: 42.8 % (ref 35.0–47.0)
HEMOGLOBIN: 14.9 g/dL (ref 12.0–16.0)
LYMPHS ABS: 3.3 10*3/uL (ref 1.0–3.6)
LYMPHS PCT: 32 %
MCH: 30.6 pg (ref 26.0–34.0)
MCHC: 34.8 g/dL (ref 32.0–36.0)
MCV: 88.1 fL (ref 80.0–100.0)
Monocytes Absolute: 0.8 10*3/uL (ref 0.2–0.9)
Monocytes Relative: 8 %
NEUTROS PCT: 57 %
Neutro Abs: 5.9 10*3/uL (ref 1.4–6.5)
PLATELETS: 294 10*3/uL (ref 150–440)
RBC: 4.85 MIL/uL (ref 3.80–5.20)
RDW: 11.7 % (ref 11.5–14.5)
WBC: 10.3 10*3/uL (ref 3.6–11.0)

## 2016-12-10 LAB — URINALYSIS, COMPLETE (UACMP) WITH MICROSCOPIC
Bilirubin Urine: NEGATIVE
Glucose, UA: NEGATIVE mg/dL
HGB URINE DIPSTICK: NEGATIVE
Ketones, ur: NEGATIVE mg/dL
NITRITE: POSITIVE — AB
PROTEIN: NEGATIVE mg/dL
RBC / HPF: NONE SEEN RBC/hpf (ref 0–5)
SPECIFIC GRAVITY, URINE: 1.008 (ref 1.005–1.030)
pH: 6 (ref 5.0–8.0)

## 2016-12-10 LAB — FIBRIN DERIVATIVES D-DIMER (ARMC ONLY): FIBRIN DERIVATIVES D-DIMER (ARMC): 178.94 (ref 0.00–499.00)

## 2016-12-10 LAB — LIPASE, BLOOD: Lipase: 29 U/L (ref 11–51)

## 2016-12-10 LAB — TROPONIN I

## 2016-12-10 MED ORDER — SULFAMETHOXAZOLE-TRIMETHOPRIM 800-160 MG PO TABS: 1.0000 | ORAL_TABLET | Freq: Two times a day (BID) | ORAL | 0 refills | Status: DC

## 2016-12-10 MED ORDER — DIPHENHYDRAMINE HCL 50 MG/ML IJ SOLN
25.0000 mg | Freq: Once | INTRAMUSCULAR | Status: AC
  Administered 2016-12-10: 25 mg via INTRAVENOUS
  Filled 2016-12-10: qty 1

## 2016-12-10 MED ORDER — KETOROLAC TROMETHAMINE 30 MG/ML IJ SOLN
15.0000 mg | Freq: Once | INTRAMUSCULAR | Status: AC
  Administered 2016-12-10: 15 mg via INTRAVENOUS
  Filled 2016-12-10: qty 1

## 2016-12-10 MED ORDER — PROMETHAZINE HCL 25 MG/ML IJ SOLN
25.0000 mg | Freq: Once | INTRAMUSCULAR | Status: AC
  Administered 2016-12-10: 25 mg via INTRAVENOUS
  Filled 2016-12-10: qty 1

## 2016-12-10 NOTE — ED Triage Notes (Signed)
Patient presents to the ED with headache and left sided jaw swelling x 1 week.  Patient also reports back pain after bowling for about 3 days.  Patient reports her headache and jaw pain have occurred previously and she states, "It's like it happens about every 3 months.  I'm tired of being in pain."

## 2016-12-10 NOTE — ED Provider Notes (Signed)
ED ECG REPORT I, Nita Sickle, the attending physician, personally viewed and interpreted this ECG.  Normal sinus rhythm, rate of 66, normal intervals, normal axis, no ST elevations or depressions, flattening T wave in aVL.   Nita Sickle, MD 12/10/16 [phone]

## 2016-12-10 NOTE — ED Provider Notes (Signed)
[facility] Emergency Department Provider Note  ____________________________________________  Time seen: Approximately 5:24 PM  I have reviewed the triage vital signs and the nursing notes.   HISTORY  Chief Complaint Headache and Jaw Pain    HPI Sari E Okelly is a 56 y.o. female who presents emergency Department with multiple medical complaints. Patient reports that she is having a six-day history of sharp, severe left sided headache. Patient also reports that she is having pain and left jaw. Patient reports that she feels the area is swollen to the jaw region. She is reporting chest pain/pressure radiating to her back. She is also endorses having epigastric pain that has been "going on for a while." Patient is also reporting some left lower quadrant abdominal pain. Patient is unable to provide any specifics on length and quality of pain. She does report that headache and left jaw pain and been ongoing 6 days, her chest/epigastric pain has been going on for "a while" and abdominal pain "comes and goes for a while." Patient reports "I'm just tired of hurting all the time." Patient denies any fevers or chills, nasal congestion and she does endorse possible, intermittent sore throat. She denies any palpitations, shortness of breath, wheezing. She denies any chronic nausea/vomiting, hematemesis, hematochezia. Patient does report some intermittent lower back pain but denies dysuria, polyuria, hematuria.  Patient does have a history of anxiety, hypertension, COPD. Patient also reports that her primary care provider has told her that she has "thyroid problems." She is unsure whether this is hypo-or hyperthyroidism. She denies any daily medications for thyroid complaint. Patient reports that she has expressed concern over her multiple symptoms/complaints to her primary care but they have been unable to arrive at a diagnosis for same.   Past Medical History:  Diagnosis Date  .  Anxiety   . COPD (chronic obstructive pulmonary disease) (HCC)   . Hypertension     There are no active problems to display for this patient.   Past Surgical History:  Procedure Laterality Date  . DILATION AND CURETTAGE OF UTERUS      Prior to Admission medications   Medication Sig Start Date End Date Taking? Authorizing Provider  ibuprofen (ADVIL,MOTRIN) 600 MG tablet Take 1 tablet (600 mg total) by mouth every 6 (six) hours as needed. 12/04/15   Chinita Pester, FNP  sulfamethoxazole-trimethoprim (BACTRIM DS,SEPTRA DS) 800-160 MG tablet Take 1 tablet by mouth 2 (two) times daily. 12/10/16   Delorise Royals Paras Kreider, PA-C    Allergies Penicillins  No family history on file.  Social History Social History  Substance Use Topics  . Smoking status: Current Every Day Smoker  . Smokeless tobacco: Never Used  . Alcohol use No     Review of Systems  Constitutional: No fever/chills Eyes: No visual changes. No discharge ENT: No upper respiratory complaints. Cardiovascular: Positive chest pain radiating to back. Respiratory: no cough. No SOB. Gastrointestinal: Is a for epigastric pain pain.  No nausea, no vomiting.  No diarrhea.  No constipation. Genitourinary: Negative for dysuria. No hematuria Musculoskeletal: Positive for left jaw pain Skin: Negative for rash, abrasions, lacerations, ecchymosis. Neurological: Positive for headache. focal weakness or numbness. 10-point ROS otherwise negative.  ____________________________________________   PHYSICAL EXAM:  VITAL SIGNS: ED Triage Vitals  Enc Vitals Group     BP 12/10/16 1618 (!) 156/81     Pulse Rate 12/10/16 1618 62     Resp 12/10/16 1618 18     Temp 12/10/16 1618 98.2 F (36.8  C)     Temp Source 12/10/16 1618 Oral     SpO2 12/10/16 1618 97 %     Weight 12/10/16 1619 125 lb (56.7 kg)     Height 12/10/16 1619  (1.575 m)     Head Circumference --      Peak Flow --      Pain Score 12/10/16 1617 10     Pain Loc --       Pain Edu? --      Excl. in GC? --      Constitutional: Alert and oriented. Well appearing and in no acute distress. Eyes: Conjunctivae are normal. PERRL. EOMI. Head: Atraumatic.No edema. Palpation of the osseous structures and soft tissue of the head and face reveal no palpable abnormality. Patient is tender to palpation along the mandible. No other tenderness to palpation of the soft tissues or osseous structures of the skull or face. No erythema. No palpable cords to the temporal region. No tenderness to palpation in this region. ENT:      Ears:       Nose: No congestion/rhinnorhea.      Mouth/Throat: Mucous membranes are moist. Oropharynx is nonerythematous and nonedematous. No signs of dental infection. Neck: No stridor.  No cervical spine tenderness to palpation. Hematological/Lymphatic/Immunilogical: No cervical lymphadenopathy. Cardiovascular: Normal rate, regular rhythm. Normal S1 and S2.  Good peripheral circulation. Respiratory: Normal respiratory effort without tachypnea or retractions. Lungs CTAB. Good air entry to the bases with no decreased or absent breath sounds. Gastrointestinal: Bowel sounds 4 quadrants. Soft palpation. Patient is tender to palpation in the epigastric region and left lower quadrant.. No guarding or rigidity. No palpable masses. No distention. No CVA tenderness. Musculoskeletal: Full range of motion to all extremities. No gross deformities appreciated. Neurologic:  Normal speech and language. No gross focal neurologic deficits are appreciated. Cranial nerves II through XII are grossly intact. Skin:  Skin is warm, dry and intact. No rash noted. Psychiatric: Mood and affect are normal. Speech and behavior are normal. Patient exhibits appropriate insight and judgement.   ____________________________________________   LABS (all labs ordered are listed, but only abnormal results are displayed)  Labs Reviewed  BASIC METABOLIC PANEL - Abnormal; Notable  for the following:       Result Value   Potassium 3.4 (*)    Glucose, Bld 101 (*)    Calcium 8.7 (*)    All other components within normal limits  URINALYSIS, COMPLETE (UACMP) WITH MICROSCOPIC - Abnormal; Notable for the following:    Color, Urine YELLOW (*)    APPearance HAZY (*)    Nitrite POSITIVE (*)    Leukocytes, UA TRACE (*)    Bacteria, UA MANY (*)    Squamous Epithelial / LPF 0-5 (*)    All other components within normal limits  TROPONIN I  LIPASE, BLOOD  CBC WITH DIFFERENTIAL/PLATELET  FIBRIN DERIVATIVES D-DIMER ([facility] ONLY)   ____________________________________________  EKG  EKG reveals normal sinus rhythm at a rate of 66 bpm. No ST elevations or depressions noted. PR, QRS, QT interval within normal limits. Normal axis. Normal EKG. ____________________________________________  RADIOLOGY Festus Barren Aalijah Lanphere, personally viewed and evaluated these images (plain radiographs) as part of my medical decision making, as well as reviewing the written report by the radiologist.  Dg Chest 2 View  Result Date: 12/10/2016 CLINICAL DATA:  Headache and left-sided facial swelling.  Back pain. EXAM: CHEST  2 VIEW COMPARISON:  06/04/2016 FINDINGS: Heart size is normal. Mediastinal shadows are  normal. The lungs are clear. No bronchial thickening. No infiltrate, mass, effusion or collapse. Pulmonary vascularity is normal. No bony abnormality. IMPRESSION: Normal Electronically Signed   By: Paulina Fusi M.D.   On: 12/10/2016 18:00    ____________________________________________    PROCEDURES  Procedure(s) performed:    Procedures    Medications  ketorolac (TORADOL) 30 MG/ML injection 15 mg (not administered)  promethazine (PHENERGAN) injection 25 mg (not administered)  diphenhydrAMINE (BENADRYL) injection 25 mg (not administered)     ____________________________________________   INITIAL IMPRESSION / ASSESSMENT AND PLAN / ED COURSE  Pertinent labs & imaging results  that were available during my care of the patient were reviewed by me and considered in my medical decision making (see chart for details).  Review of the [place] CSRS was performed in accordance of the [facility] prior to dispensing any controlled drugs.  Clinical Course as of Dec 11 1930  Tue Dec 10, 2016  1610 Patient presented to the emergency Department with multiple medical complaints to include headache, jaw pain, chest pain, epigastric pain, pain rating from the chest to the back, left lower quadrant pain. Patient reports the symptoms have been intermittent for bearing with time. She has been evaluated by primary care for these complaints with no definitive diagnosis. At this time, multiple medical complaints, chest x-ray, EKG, d-dimer, CBC, CMP, lipase, troponin, urinalysis is ordered. Urinalysis returns with results consistent with UTI. Otherwise, labs are reassuring with no indication of acute abnormality. Exam was grossly reassuring with no acute findings. At this time, intermittent headache is likely migraine related. Patient does have a history of recurrent epigastric pain but no concerning signs of hematochezia or hematemesis warranting further investigation at this time. Patient will be treated for her UTI and advised to follow-up with gastroenterology or primary care for ongoing recurrent epigastric pain. Patient will be given migraine cocktail emergency department for headache relief. If symptoms worsen, change, patient will follow-up with primary care or emergency department.  [JC]    Clinical Course User Index [JC] Delorise Royals Nayara Taplin, PA-C    Patient's diagnosis is consistent with Headache, epigastric pain, UTI. The patient presented with multiple medical complaints. Labs, imaging, EKG are mostly reassuring with on significant findings being UTI. Patient will be treated with antibiotics for same. Patient's physical exam was reassuring with no acute findings. Patient has had intermittent  headaches and epigastric pain for months on end. They have likely migraines and will be treated with migraine cocktail emergency department. Patient does have significant rales on anxiety and patient states "some of the symptoms may be related to same." At this time, patient continues to have epigastric pain with no relief, she is to follow-up with gastroenterology or primary care.. Patient will be discharged home with prescriptions for Bactrim for UTI. Patient is to follow up with primary care or gastroenterology as needed or otherwise directed. Patient is given ED precautions to return to the ED for any worsening or new symptoms.     ____________________________________________  FINAL CLINICAL IMPRESSION(S) / ED DIAGNOSES  Final diagnoses:  Acute nonintractable headache, unspecified headache type  Epigastric pain  Urinary tract infection without hematuria, site unspecified      NEW MEDICATIONS STARTED DURING THIS VISIT:  New Prescriptions   SULFAMETHOXAZOLE-TRIMETHOPRIM (BACTRIM DS,SEPTRA DS) 800-160 MG TABLET    Take 1 tablet by mouth 2 (two) times daily.        This chart was dictated using voice recognition software/Dragon. Despite best efforts to proofread, errors can occur which  can change the meaning. Any change was purely unintentional.    Racheal Patches, PA-C 12/10/16 1932    Willy Eddy, MD 12/10/16 2116

## 2016-12-10 NOTE — ED Notes (Signed)
Examined by Provider   She informed provider that she was having some epigastric pain which radiates into back

## 2016-12-10 NOTE — ED Notes (Signed)
20g iv removed from right forearm.  D/c inst to pt.  Pt alert.  No pain. Family with pt.

## 2016-12-10 NOTE — ED Notes (Signed)
See triage note   Has had headache and jaw swelling for about 1 week  No fever or trauma

## 2016-12-28 ENCOUNTER — Encounter: Payer: Self-pay | Admitting: Medical Oncology

## 2016-12-28 ENCOUNTER — Emergency Department
Admission: EM | Admit: 2016-12-28 | Discharge: 2016-12-28 | Disposition: A | Discharge status: Home / Self Care | Payer: Medicaid Other | Attending: Emergency Medicine | Admitting: Emergency Medicine

## 2016-12-28 DIAGNOSIS — Y939 Activity, unspecified: Secondary | ICD-10-CM | POA: Insufficient documentation

## 2016-12-28 DIAGNOSIS — F17200 Nicotine dependence, unspecified, uncomplicated: Secondary | ICD-10-CM | POA: Diagnosis not present

## 2016-12-28 DIAGNOSIS — I10 Essential (primary) hypertension: Secondary | ICD-10-CM | POA: Diagnosis not present

## 2016-12-28 DIAGNOSIS — J449 Chronic obstructive pulmonary disease, unspecified: Secondary | ICD-10-CM | POA: Diagnosis not present

## 2016-12-28 DIAGNOSIS — S61012A Laceration without foreign body of left thumb without damage to nail, initial encounter: Secondary | ICD-10-CM | POA: Diagnosis present

## 2016-12-28 DIAGNOSIS — Y929 Unspecified place or not applicable: Secondary | ICD-10-CM | POA: Insufficient documentation

## 2016-12-28 DIAGNOSIS — Y999 Unspecified external cause status: Secondary | ICD-10-CM | POA: Diagnosis not present

## 2016-12-28 DIAGNOSIS — W268XXA Contact with other sharp object(s), not elsewhere classified, initial encounter: Secondary | ICD-10-CM | POA: Insufficient documentation

## 2016-12-28 DIAGNOSIS — Z79899 Other long term (current) drug therapy: Secondary | ICD-10-CM | POA: Insufficient documentation

## 2016-12-28 MED ORDER — LIDOCAINE HCL (PF) 1 % IJ SOLN
5.0000 mL | Freq: Once | INTRAMUSCULAR | Status: DC
  Filled 2016-12-28: qty 5

## 2016-12-28 NOTE — ED Notes (Signed)
Pt stating "I just about cut my finger off." Pt has a laceration about 4cm in length to 1st finger on left hand. Pt was cut by a VCR . Bleeding is controlled at this time. Pt had tetanus about 3 years ago.

## 2016-12-28 NOTE — ED Notes (Signed)
Pt is concerned that she has "lost a lot of blood." Pt appears to be under the influence of something at this time. Pt stating that she washed her wound out with peroxide. Pt is slurring her words at times but denies taking anything or drinking any alcohol.

## 2016-12-28 NOTE — ED Triage Notes (Signed)
Pt has lac to left hand thumb that occurred today with a piece of metal.

## 2016-12-28 NOTE — ED Provider Notes (Signed)
[facility] Emergency Department Provider Note ____________________________________________  Time seen: 1508  I have reviewed the triage vital signs and the nursing notes.  HISTORY  Chief Complaint  Laceration  HPI Cassandra Jennings is a 56 y.o. female presents to the ED for treatment of a laceration to the left thumb. She accidentally cut her thumb on a VCR. She denies any other injury. She notes her tetanus was about 3 years ago.  Past Medical History:  Diagnosis Date  . Anxiety   . COPD (chronic obstructive pulmonary disease) (HCC)   . Hypertension     There are no active problems to display for this patient.   Past Surgical History:  Procedure Laterality Date  . DILATION AND CURETTAGE OF UTERUS      Prior to Admission medications   Medication Sig Start Date End Date Taking? Authorizing Provider  ibuprofen (ADVIL,MOTRIN) 600 MG tablet Take 1 tablet (600 mg total) by mouth every 6 (six) hours as needed. 12/04/15   Chinita Pester, FNP  sulfamethoxazole-trimethoprim (BACTRIM DS,SEPTRA DS) 800-160 MG tablet Take 1 tablet by mouth 2 (two) times daily. 12/10/16   Delorise Royals Cuthriell, PA-C    Allergies Penicillins  No family history on file.  Social History Social History  Substance Use Topics  . Smoking status: Current Every Day Smoker  . Smokeless tobacco: Never Used  . Alcohol use No    Review of Systems  Constitutional: Negative for fever. Cardiovascular: Negative for chest pain. Respiratory: Negative for shortness of breath. Musculoskeletal: Negative for back pain. Skin: Negative for rash. Left thumb laceration as above. Neurological: Negative for headaches, focal weakness or numbness. ____________________________________________  PHYSICAL EXAM:  VITAL SIGNS: ED Triage Vitals  Enc Vitals Group     BP 12/28/16 1448 129/68     Pulse Rate 12/28/16 1448 77     Resp 12/28/16 1448 18     Temp 12/28/16 1448 97.6 F (36.4 C)     Temp  Source 12/28/16 1448 Oral     SpO2 12/28/16 1448 98 %     Weight 12/28/16 1443 125 lb (56.7 kg)     Height 12/28/16 1443  (1.575 m)     Head Circumference --      Peak Flow --      Pain Score 12/28/16 1459 10     Pain Loc --      Pain Edu? --      Excl. in GC? --     Constitutional: Alert and oriented. Well appearing and in no distress. Head: Normocephalic and atraumatic. Cardiovascular: Normal rate, regular rhythm. Normal distal pulses. Respiratory: Normal respiratory effort. Musculoskeletal: Normal composite fist. Nontender with normal range of motion in all extremities.  Neurologic:  Normal gait without ataxia. Normal speech and language. No gross focal neurologic deficits are appreciated. Skin:  Skin is warm, dry and intact. No rash noted. ____________________________________________  LACERATION REPAIR Performed by: Lissa Hoard Authorized by: Lissa Hoard Consent: Verbal consent obtained. Risks and benefits: risks, benefits and alternatives were discussed Consent given by: patient Patient identity confirmed: provided demographic data Prepped and Draped in normal sterile fashion Wound explored  Laceration Location: left thumb  Laceration Length: 4 cm  No Foreign Bodies seen or palpated  Anesthesia: digital infiltration  Local anesthetic: lidocaine 1% w/o epinephrine  Anesthetic total: 4 ml  Irrigation method: syringe Amount of cleaning: standard  Skin closure: 4-0 nylon  Number of sutures: 7  Technique: interrupted  Patient  tolerance: Patient tolerated the procedure well with no immediate complications. ____________________________________________  INITIAL IMPRESSION / ASSESSMENT AND PLAN / ED COURSE  Patient with a left thumb laceration s/p suture repair. She will follow-up with Dr. Lacie Scotts for suture removal in 7-10 days.  ____________________________________________  FINAL CLINICAL IMPRESSION(S) / ED DIAGNOSES  Final  diagnoses:  Laceration of left thumb without foreign body without damage to nail, initial encounter      Lissa Hoard, PA-C 12/28/16 1629    Sharyn Creamer, MD 12/28/16 2136

## 2016-12-28 NOTE — Discharge Instructions (Signed)
You should keep the wound clean, dry, and covered. Wash only with soap and water as needed. Follow-up with your provider for suture removal in 7-10 days. Take Tylenol or Motrin for pain relief.

## 2017-02-24 ENCOUNTER — Other Ambulatory Visit: Payer: Self-pay | Admitting: Family Medicine

## 2017-02-24 DIAGNOSIS — Z1231 Encounter for screening mammogram for malignant neoplasm of breast: Secondary | ICD-10-CM

## 2018-01-14 ENCOUNTER — Emergency Department
Admission: EM | Admit: 2018-01-14 | Discharge: 2018-01-14 | Disposition: A | Discharge status: Home / Self Care | Payer: Medicaid Other | Attending: Emergency Medicine | Admitting: Emergency Medicine

## 2018-01-14 ENCOUNTER — Encounter: Payer: Self-pay | Admitting: Emergency Medicine

## 2018-01-14 ENCOUNTER — Other Ambulatory Visit: Payer: Self-pay

## 2018-01-14 ENCOUNTER — Emergency Department: Payer: Medicaid Other

## 2018-01-14 DIAGNOSIS — F17200 Nicotine dependence, unspecified, uncomplicated: Secondary | ICD-10-CM | POA: Diagnosis not present

## 2018-01-14 DIAGNOSIS — Z79899 Other long term (current) drug therapy: Secondary | ICD-10-CM | POA: Insufficient documentation

## 2018-01-14 DIAGNOSIS — B852 Pediculosis, unspecified: Secondary | ICD-10-CM | POA: Diagnosis not present

## 2018-01-14 DIAGNOSIS — J449 Chronic obstructive pulmonary disease, unspecified: Secondary | ICD-10-CM | POA: Insufficient documentation

## 2018-01-14 DIAGNOSIS — J209 Acute bronchitis, unspecified: Secondary | ICD-10-CM | POA: Diagnosis not present

## 2018-01-14 DIAGNOSIS — B850 Pediculosis due to Pediculus humanus capitis: Secondary | ICD-10-CM

## 2018-01-14 DIAGNOSIS — R05 Cough: Secondary | ICD-10-CM | POA: Diagnosis present

## 2018-01-14 DIAGNOSIS — I10 Essential (primary) hypertension: Secondary | ICD-10-CM | POA: Diagnosis not present

## 2018-01-14 MED ORDER — NAPROXEN 500 MG PO TBEC: 500.0000 mg | DELAYED_RELEASE_TABLET | Freq: Two times a day (BID) | ORAL | 0 refills | Status: DC

## 2018-01-14 MED ORDER — DOXYCYCLINE HYCLATE 100 MG PO CAPS: 100.0000 mg | ORAL_CAPSULE | Freq: Two times a day (BID) | ORAL | 0 refills | Status: DC

## 2018-01-14 MED ORDER — PREDNISONE 10 MG (21) PO TBPK: ORAL_TABLET | ORAL | 0 refills | Status: DC

## 2018-01-14 MED ORDER — ALBUTEROL SULFATE HFA 108 (90 BASE) MCG/ACT IN AERS: 2.0000 | INHALATION_SPRAY | Freq: Four times a day (QID) | RESPIRATORY_TRACT | 2 refills | Status: AC | PRN

## 2018-01-14 MED ORDER — FLUTICASONE-SALMETEROL 115-21 MCG/ACT IN AERO: 2.0000 | INHALATION_SPRAY | Freq: Two times a day (BID) | RESPIRATORY_TRACT | 12 refills | Status: DC

## 2018-01-14 NOTE — ED Notes (Signed)
First Nurse Note: Patient states she has had pain in her mid abdomen for over a year.  States she has been in bed X 2 days.  Alert and oriented.

## 2018-01-14 NOTE — ED Notes (Signed)
See triage note.

## 2018-01-14 NOTE — ED Notes (Signed)
See triage note  Presents with vague sx's which have been going on for about 1 year  Has been seen by PCP   Dx;d with "walking "pneumonia   conts to have cough,and discomfort across upper abd Jonni Sanger

## 2018-01-14 NOTE — Discharge Instructions (Addendum)
Follow-up with your regular doctor if not better in 3 to 5 days.  Return emergency department if worsening.  Use medication as prescribed.  For the lice use over-the-counter cough the medicine such as Nix.  You could also use mayonnaise or all of oil on your here for 30 minutes with a shower cap on it.  Repeat this at least 3 times.  Use a small comb to remove the lice the most of them are at the back of your neck.

## 2018-01-14 NOTE — ED Provider Notes (Signed)
[facility] Emergency Department Provider Note  ____________________________________________   First MD Initiated Contact with Patient 01/14/18 1030     (approximate)  I have reviewed the triage vital signs and the nursing notes.   HISTORY  Chief Complaint Generalized Body Aches    HPI Cassandra Jennings is a 57 y.o. female reports the emergency department complaining of vague symptoms.  She states she has had a cough she can get rid of.  She was treated with Z-Pak 2 weeks ago.  She states she felt much better when she was on her Xanax.  She is a smoker.  She is followed by Dr. Lacie Scotts.  She denies any fever or chills.  She does state that the mucus is been yellow when she coughs it up.  She states Dr. Lacie Scotts told her she had walking pneumonia couple weeks ago.  She is concerned she just does not still feel well.  She does not like the indomethacin he put her on for pain because this makes her go the bathroom a lot.  She does not want meloxicam either because that does not help either.  Past Medical History:  Diagnosis Date  . Anxiety   . COPD (chronic obstructive pulmonary disease) (HCC)   . Hypertension     There are no active problems to display for this patient.   Past Surgical History:  Procedure Laterality Date  . DILATION AND CURETTAGE OF UTERUS      Prior to Admission medications   Medication Sig Start Date End Date Taking? Authorizing Provider  hydrOXYzine (ATARAX/VISTARIL) 25 MG tablet Take 25 mg by mouth 3 (three) times daily as needed.   Yes [provider]  lisinopril (PRINIVIL,ZESTRIL) 20 MG tablet Take 20 mg by mouth daily.   Yes [provider]  pantoprazole (PROTONIX) 40 MG tablet Take 40 mg by mouth daily.   Yes [provider]  albuterol (PROVENTIL HFA;VENTOLIN HFA) 108 (90 Base) MCG/ACT inhaler Inhale 2 puffs into the lungs every 6 (six) hours as needed for wheezing or shortness of breath. 01/14/18    Rahman Ferrall, Roselyn Bering, PA-C  doxycycline (VIBRAMYCIN) 100 MG capsule Take 1 capsule (100 mg total) by mouth 2 (two) times daily. 01/14/18   Sherrie Mustache Roselyn Bering, PA-C  fluticasone-salmeterol (ADVAIR HFA) [phone] MCG/ACT inhaler Inhale 2 puffs into the lungs 2 (two) times daily. 01/14/18   Sherrie Mustache Roselyn Bering, PA-C  naproxen (EC NAPROSYN) 500 MG EC tablet Take 1 tablet (500 mg total) by mouth 2 (two) times daily with a meal. 01/14/18   Yasser Hepp, Roselyn Bering, PA-C  predniSONE (STERAPRED UNI-PAK 21 TAB) 10 MG (21) TBPK tablet Take 6 pills on day one then decrease by 1 pill each day 01/14/18   Faythe Ghee, PA-C    Allergies Penicillins  No family history on file.  Social History Social History   Tobacco Use  . Smoking status: Current Every Day Smoker  . Smokeless tobacco: Never Used  Substance Use Topics  . Alcohol use: Yes    Comment: occas.   . Drug use: Yes    Types: Marijuana    Review of Systems  Constitutional: No fever/chills Eyes: No visual changes. ENT: No sore throat. Respiratory: Positive cough Genitourinary: Negative for dysuria. Musculoskeletal: Negative for back pain.  Positive for chronic joint pain Skin: Negative for rash.  Positive for itchy scalp    ____________________________________________   PHYSICAL EXAM:  VITAL SIGNS: ED Triage Vitals  Enc Vitals Group  BP --      Pulse --      Resp --      Temp --      Temp src --      SpO2 --      Weight 01/14/18 1004 110 lb (49.9 kg)     Height 01/14/18 1004  (1.575 m)     Head Circumference --      Peak Flow --      Pain Score 01/14/18 1003 10     Pain Loc --      Pain Edu? --      Excl. in GC? --     Constitutional: Alert and oriented. Well appearing and in no acute distress. Eyes: Conjunctivae are normal.  Head: Atraumatic. Nose: No congestion/rhinnorhea. Mouth/Throat: Mucous membranes are moist.   Neck: Supple, no lymphadenopathy is noted Cardiovascular: Normal rate, regular rhythm.  Heart sounds are  normal Respiratory: Normal respiratory effort.  No retractions, lungs are clear to auscultation, cough is dry and harsh GU: deferred Musculoskeletal: FROM all extremities, warm and well perfused Neurologic:  Normal speech and language.  Skin:  Skin is warm, dry and intact. No rash noted.  Positive for lice at the posterior neck Psychiatric: Mood and affect are normal. Speech and behavior are normal.  ____________________________________________   LABS (all labs ordered are listed, but only abnormal results are displayed)  Labs Reviewed - No data to display ____________________________________________   ____________________________________________  RADIOLOGY  Chest x-rays negative for pneumonia positive for COPD  ____________________________________________   PROCEDURES  Procedure(s) performed: No  Procedures    ____________________________________________   INITIAL IMPRESSION / ASSESSMENT AND PLAN / ED COURSE  Pertinent labs & imaging results that were available during my care of the patient were reviewed by me and considered in my medical decision making (see chart for details).  Patient is a 57 year old female presents emergency department complaining of vague symptoms such as cough this been ongoing and joint pain and she wants Xanax.  She is Dr. numerous patient.  He can put her on indomethacin but she does not like.  She also does not want meloxicam.  She is concerned because the cough has not gone away even though she was treated with Z-Pak couple weeks ago.  She states she is still coughing up yellow mucus.  She is also had a itchy scalp.  On physical exam patient appears well, her thoughts are scattered such as a person with ADD would have.  Her lungs are clear to auscultation but her cough is dry and harsh.  She does have lice at the posterior scalp.  Remainder the exam is benign  Chest x-ray ordered  Chest x-ray is negative for pneumonia, positive for  COPD  Spine chest x-ray the patient.  She is given a prescription for doxycycline, Sterapred, albuterol, Advair 115 inhaler, and naproxen.  She is to follow-up with her regular doctor.  She is to use over-the-counter lice remover such as Nix.  She also encouraged her to use tea tree oil, mayonnaise or all of oil if this does not work.  She is to reapply this at least 3 times.  You small, to remove the lice.  She states she understands on how to do these procedures.  She will take medications as prescribed.  Follow-up with her regular doctor as needed.  She was discharged in stable condition     As part of my medical decision making, I reviewed the following data within the  electronic MEDICAL RECORD NUMBER Nursing notes reviewed and incorporated, Old chart reviewed, Radiograph reviewed chest x-rays negative for pneumonia, Notes from prior ED visits and [place] Controlled Substance Database  ____________________________________________   FINAL CLINICAL IMPRESSION(S) / ED DIAGNOSES  Final diagnoses:  Acute bronchitis, unspecified organism  Lice infested hair      NEW MEDICATIONS STARTED DURING THIS VISIT:  New Prescriptions   ALBUTEROL (PROVENTIL HFA;VENTOLIN HFA) 108 (90 BASE) MCG/ACT INHALER    Inhale 2 puffs into the lungs every 6 (six) hours as needed for wheezing or shortness of breath.   DOXYCYCLINE (VIBRAMYCIN) 100 MG CAPSULE    Take 1 capsule (100 mg total) by mouth 2 (two) times daily.   FLUTICASONE-SALMETEROL (ADVAIR HFA) 115-21 MCG/ACT INHALER    Inhale 2 puffs into the lungs 2 (two) times daily.   NAPROXEN (EC NAPROSYN) 500 MG EC TABLET    Take 1 tablet (500 mg total) by mouth 2 (two) times daily with a meal.   PREDNISONE (STERAPRED UNI-PAK 21 TAB) 10 MG (21) TBPK TABLET    Take 6 pills on day one then decrease by 1 pill each day     Note:  This document was prepared using Dragon voice recognition software and may include unintentional dictation errors.    Faythe Ghee,  PA-C 01/14/18 1238    Don Perking, [place], MD 01/14/18 [phone]

## 2018-01-14 NOTE — ED Triage Notes (Signed)
Pt states body aches and cough for a couple of years now, states fever off and on.  Poor historian.

## 2018-09-09 ENCOUNTER — Ambulatory Visit: Payer: Self-pay | Attending: Oncology

## 2019-01-06 ENCOUNTER — Ambulatory Visit: Payer: Self-pay

## 2020-04-12 ENCOUNTER — Ambulatory Visit: Payer: Medicaid Other

## 2020-04-28 ENCOUNTER — Other Ambulatory Visit: Payer: Self-pay | Admitting: Family Medicine

## 2020-04-28 DIAGNOSIS — Z1231 Encounter for screening mammogram for malignant neoplasm of breast: Secondary | ICD-10-CM

## 2020-05-03 ENCOUNTER — Ambulatory Visit
Admission: RE | Admit: 2020-05-03 | Discharge: 2020-05-03 | Disposition: A | Discharge status: Home / Self Care | Payer: Medicaid Other | Source: Ambulatory Visit | Attending: Oncology | Admitting: Oncology

## 2020-05-03 ENCOUNTER — Other Ambulatory Visit: Payer: Self-pay

## 2020-05-03 ENCOUNTER — Ambulatory Visit: Payer: Medicaid Other | Attending: Oncology

## 2020-05-03 VITALS — BP 158/87 | HR 57 | Temp 98.6°F | Ht 63.16 in | Wt 112.4 lb

## 2020-05-03 DIAGNOSIS — Z0000 Encounter for general adult medical examination without abnormal findings: Secondary | ICD-10-CM | POA: Insufficient documentation

## 2020-05-03 NOTE — Progress Notes (Signed)
  Subjective:     Patient ID: Cassandra Jennings, female   DOB: 1961/04/20, 59 y.o.   MRN: [id number]  HPI   Review of Systems     Objective:   Physical Exam Chest:     Breasts:        Right: No swelling, bleeding, inverted nipple, mass, nipple discharge, skin change or tenderness.        Left: No swelling, bleeding, inverted nipple, mass, nipple discharge, skin change or tenderness.        Assessment:     59 year old patient presents for [facility] clinic visit.  Patient screened, and meets BCCCP eligibility.  Patient does not have insurance, Medicare or Medicaid.  Instructed patient on breast self awareness using teach back method.  Clinical breast exam unremarkable.  Risk Assessment    Risk Scores      05/03/2020   Last edited by: Jim Like, RN   5-year risk: 0.9 %   Lifetime risk: 4.9 %            Plan:     Sent for bilateral screening mammogram.

## 2020-05-04 ENCOUNTER — Other Ambulatory Visit: Payer: Self-pay | Admitting: *Deleted

## 2020-05-04 DIAGNOSIS — N6489 Other specified disorders of breast: Secondary | ICD-10-CM

## 2020-07-20 NOTE — Progress Notes (Signed)
Patient did not respond to certified letter or phone calls recommending additional view mammogram.  Case closed.

## 2020-10-05 ENCOUNTER — Other Ambulatory Visit: Payer: Self-pay

## 2021-11-14 ENCOUNTER — Emergency Department: Payer: Medicaid Other

## 2021-11-14 ENCOUNTER — Other Ambulatory Visit: Payer: Self-pay

## 2021-11-14 ENCOUNTER — Encounter: Payer: Self-pay | Admitting: Intensive Care

## 2021-11-14 ENCOUNTER — Emergency Department
Admission: EM | Admit: 2021-11-14 | Discharge: 2021-11-14 | Disposition: A | Discharge status: Home / Self Care | Payer: Medicaid Other | Attending: Emergency Medicine | Admitting: Emergency Medicine

## 2021-11-14 DIAGNOSIS — R0902 Hypoxemia: Secondary | ICD-10-CM | POA: Insufficient documentation

## 2021-11-14 DIAGNOSIS — R519 Headache, unspecified: Secondary | ICD-10-CM | POA: Insufficient documentation

## 2021-11-14 DIAGNOSIS — R001 Bradycardia, unspecified: Secondary | ICD-10-CM | POA: Insufficient documentation

## 2021-11-14 DIAGNOSIS — E876 Hypokalemia: Secondary | ICD-10-CM | POA: Insufficient documentation

## 2021-11-14 DIAGNOSIS — R42 Dizziness and giddiness: Secondary | ICD-10-CM | POA: Insufficient documentation

## 2021-11-14 DIAGNOSIS — Z87891 Personal history of nicotine dependence: Secondary | ICD-10-CM | POA: Insufficient documentation

## 2021-11-14 DIAGNOSIS — Z79899 Other long term (current) drug therapy: Secondary | ICD-10-CM | POA: Insufficient documentation

## 2021-11-14 HISTORY — DX: Inflammatory liver disease, unspecified: K75.9

## 2021-11-14 LAB — COMPREHENSIVE METABOLIC PANEL
ALT: 24 U/L (ref 0–44)
AST: 32 U/L (ref 15–41)
Albumin: 3.9 g/dL (ref 3.5–5.0)
Alkaline Phosphatase: 45 U/L (ref 38–126)
Anion gap: 10 (ref 5–15)
BUN: 13 mg/dL (ref 8–23)
CO2: 27 mmol/L (ref 22–32)
Calcium: 9.1 mg/dL (ref 8.9–10.3)
Chloride: 101 mmol/L (ref 98–111)
Creatinine, Ser: 0.67 mg/dL (ref 0.44–1.00)
GFR, Estimated: >60 mL/min (ref >60)
Glucose, Bld: 84 mg/dL (ref 70–99)
Potassium: 3.3 mmol/L — ABNORMAL LOW (ref 3.5–5.1)
Sodium: 138 mmol/L (ref 135–145)
Total Bilirubin: 0.5 mg/dL (ref 0.3–1.2)
Total Protein: 7.5 g/dL (ref 6.5–8.1)

## 2021-11-14 LAB — CBC WITH DIFFERENTIAL/PLATELET
Abs Immature Granulocytes: 0.01 10*3/uL (ref 0.00–0.07)
Basophils Absolute: 0.1 10*3/uL (ref 0.0–0.1)
Basophils Relative: 1 %
Eosinophils Absolute: 0.3 10*3/uL (ref 0.0–0.5)
Eosinophils Relative: 5 %
HCT: 39.5 % (ref 36.0–46.0)
Hemoglobin: 13.2 g/dL (ref 12.0–15.0)
Immature Granulocytes: 0 %
Lymphocytes Relative: 43 %
Lymphs Abs: 2.7 10*3/uL (ref 0.7–4.0)
MCH: 29.5 pg (ref 26.0–34.0)
MCHC: 33.4 g/dL (ref 30.0–36.0)
MCV: 88.4 fL (ref 80.0–100.0)
Monocytes Absolute: 0.5 10*3/uL (ref 0.1–1.0)
Monocytes Relative: 9 %
Neutro Abs: 2.6 10*3/uL (ref 1.7–7.7)
Neutrophils Relative %: 42 %
Platelets: 300 10*3/uL (ref 150–400)
RBC: 4.47 MIL/uL (ref 3.87–5.11)
RDW: 11.8 % (ref 11.5–15.5)
WBC: 6.2 10*3/uL (ref 4.0–10.5)
nRBC: 0 % (ref 0.0–0.2)

## 2021-11-14 LAB — TROPONIN I (HIGH SENSITIVITY): Troponin I (High Sensitivity): 6 ng/L (ref <18)

## 2021-11-14 MED ORDER — IBUPROFEN 600 MG PO TABS: 600.0000 mg | ORAL_TABLET | Freq: Four times a day (QID) | ORAL | 0 refills | Status: AC | PRN

## 2021-11-14 MED ORDER — LIDOCAINE 5 % EX PTCH: 1.0000 | MEDICATED_PATCH | Freq: Two times a day (BID) | CUTANEOUS | 0 refills | Status: AC

## 2021-11-14 MED ORDER — DEXAMETHASONE SODIUM PHOSPHATE 10 MG/ML IJ SOLN
10.0000 mg | Freq: Once | INTRAMUSCULAR | Status: AC
  Administered 2021-11-14: 10 mg via INTRAVENOUS
  Filled 2021-11-14: qty 1

## 2021-11-14 MED ORDER — IOHEXOL 350 MG/ML SOLN
75.0000 mL | Freq: Once | INTRAVENOUS | Status: AC | PRN
  Administered 2021-11-14: 75 mL via INTRAVENOUS
  Filled 2021-11-14: qty 75

## 2021-11-14 MED ORDER — METOCLOPRAMIDE HCL 5 MG/ML IJ SOLN
10.0000 mg | Freq: Once | INTRAMUSCULAR | Status: AC
  Administered 2021-11-14: 10 mg via INTRAVENOUS
  Filled 2021-11-14: qty 2

## 2021-11-14 MED ORDER — POTASSIUM CHLORIDE CRYS ER 20 MEQ PO TBCR
40.0000 meq | EXTENDED_RELEASE_TABLET | Freq: Once | ORAL | Status: AC
  Administered 2021-11-14: 40 meq via ORAL
  Filled 2021-11-14: qty 2

## 2021-11-14 MED ORDER — DIPHENHYDRAMINE HCL 50 MG/ML IJ SOLN
12.5000 mg | Freq: Once | INTRAMUSCULAR | Status: AC
  Administered 2021-11-14: 12.5 mg via INTRAVENOUS
  Filled 2021-11-14: qty 1

## 2021-11-14 MED ORDER — LIDOCAINE 5 % EX PTCH
1.0000 | MEDICATED_PATCH | CUTANEOUS | Status: DC
  Administered 2021-11-14: 1 via TRANSDERMAL
  Filled 2021-11-14: qty 1

## 2021-11-14 MED ORDER — ACETAMINOPHEN 500 MG PO TABS
1000.0000 mg | ORAL_TABLET | Freq: Once | ORAL | Status: AC
  Administered 2021-11-14: 1000 mg via ORAL
  Filled 2021-11-14: qty 2

## 2021-11-14 NOTE — Discharge Instructions (Addendum)
Tylenol 1 every 8 hours. Ibuprofen with food and lidocaine patches for upper neck pain.  Call the neurology number if you continue to have headaches and return to the ER if things are worsening or you have any other concerns ? ?IMPRESSION: ?1.  No acute intracranial process. ?2.  No intracranial large vessel occlusion or significant stenosis. ?3.  No hemodynamically significant stenosis in the neck. ?

## 2021-11-14 NOTE — ED Notes (Signed)
Frontal HA behind her eyes and in the occipital region of her head. Denies phono and photo sensitivity. Denies N/V with HA. Has been taking ASA for her headache without relief. A&Ox4. Skin p/w/d. RR even and nonlabored. 8/10 pain.  ?

## 2021-11-14 NOTE — ED Provider Notes (Signed)
? ?[facility] ?Provider Note ? ? ? Event Date/Time  ? First MD Initiated Contact with Patient 11/14/21 1723   ?  (approximate) ? ? ?History  ? ?Headache ? ? ?HPI ? ?Cassandra Jennings is a 61 y.o. female with chronic hepatitis C who comes in with concerns for headache.  Patient reports intermittent headaches over the past month which is atypical for her and then this today she reports having a more sudden onset headache associated with some dizziness.  She was told by her primary care doctor to come in to be evaluated.  She reports her current headache is an 8 out of 10.  Denies any vision changes, denies any chest pain, shortness of breath, abdominal pain.  She reports that she does have a history of smoking but denies any new shortness of breath. ? ?Physical Exam  ? ?Triage Vital Signs: ?ED Triage Vitals [11/14/21 1612]  ?Enc Vitals Group  ?   BP 139/88  ?   Pulse Rate 62  ?   Resp 16  ?   Temp 99.1 ?F (37.3 ?C)  ?   Temp Source Oral  ?   SpO2 97 %  ?   Weight 111 lb (50.3 kg)  ?   Height 5\' 2"  (1.575 m)  ?   Head Circumference   ?   Peak Flow   ?   Pain Score 5  ?   Pain Loc   ?   Pain Edu?   ?   Excl. in GC?   ? ? ?Most recent vital signs: ?Vitals:  ? 11/14/21 1612 11/14/21 1723  ?BP: 139/88 (!) 152/92  ?Pulse: 62 (!) 59  ?Resp: 16 18  ?Temp: 99.1 ?F (37.3 ?C)   ?SpO2: 97% 93%  ? ? ? ?General: Awake, no distress.  ?CV:  Good peripheral perfusion.  ?Resp:  Normal effort.  ?Abd:  No distention.  ?Other:  Pupils reactive bilaterally.  Extraocular movements are intact.  Equal strength in arms and legs.  Cranial nerves appear intact ? ? ?ED Results / Procedures / Treatments  ? ?Labs ?(all labs ordered are listed, but only abnormal results are displayed) ?Labs Reviewed  ?CBC WITH DIFFERENTIAL/PLATELET  ?COMPREHENSIVE METABOLIC PANEL  ?TROPONIN I (HIGH SENSITIVITY)  ? ? ? ?EKG ? ?My interpretation of EKG: ? ?Sinus bradycardia rate of 54 without any ST elevation or T wave inversions, normal intervals  with sinus arrhythmia. ? ?RADIOLOGY ?I personally reviewed the CT head without evidence of intracranial hemorrhage pending CTA read ? ?PROCEDURES: ? ?Critical Care performed: No ? ?Procedures ? ? ?MEDICATIONS ORDERED IN ED: ?Medications  ?lidocaine (LIDODERM) 5 % 1 patch (1 patch Transdermal Patch Applied 11/14/21 1804)  ?metoCLOPramide (REGLAN) injection 10 mg (10 mg Intravenous Given 11/14/21 1804)  ?diphenhydrAMINE (BENADRYL) injection 12.5 mg (12.5 mg Intravenous Given 11/14/21 1805)  ?acetaminophen (TYLENOL) tablet 1,000 mg (1,000 mg Oral Given 11/14/21 1805)  ?potassium chloride SA (KLOR-CON M) CR tablet 40 mEq (40 mEq Oral Given 11/14/21 1905)  ?iohexol (OMNIPAQUE) 350 MG/ML injection 75 mL (75 mLs Intravenous Contrast Given 11/14/21 1854)  ? ? ? ?IMPRESSION / MDM / ASSESSMENT AND PLAN / ED COURSE  ?I reviewed the triage vital signs and the nursing notes. ?             ?               ? ?Patient comes in with intermittent headaches that have now gotten worse.  Given patient's age  and never having headaches previously will get CTA to rule out aneurysm given it was associated with some dizziness as well.  Right now her neuro exam seems reassuring.  Will get labs, EKG, troponin to evaluate for ACS.  Patient has slightly low oxygen saturation noted on triage but she reports being a heavy smoker and history of bronchitis.  Denies any acute new shortness of breath or concerns of this. ? ?IMPRESSION: ?1.  No acute intracranial process. ?2.  No intracranial large vessel occlusion or significant stenosis. ?3.  No hemodynamically significant stenosis in the neck. ? ?CBC normal troponin negative and symptoms of been going on for greater than 3 hours.  CMP slightly low potassium given some oral repletion ? ?On repeat evaluation patient reports feeling much better.  Headache is significantly lowered and she is ambulatory without any dizziness.  Her finger-to-nose are intact bilaterally.  At this time I have low suspicion for  posterior stroke given reassuring CTA low suspicion for subarachnoid hemorrhage given it seems pretty atypical for that.  We discussed using Tylenol, ibuprofen at home.  We will give a dose of dexamethasone to help prevent reoccurrence.  Patient is now is reporting a lot of stress.  She reports the lidocaine patches helped a lot.  We will also prescribe for this as well. ? ?I discussed the provisional nature of ED diagnosis, the treatment so far, the ongoing plan of care, follow up appointments and return precautions with the patient and any family or support people present. They expressed understanding and agreed with the plan, discharged home. ? ? ? ? ? ?FINAL CLINICAL IMPRESSION(S) / ED DIAGNOSES  ? ?Final diagnoses:  ?Acute intractable headache, unspecified headache type  ? ? ? ?Rx / DC Orders  ? ?ED Discharge Orders   ? ?      Ordered  ?  lidocaine (LIDODERM) 5 %  Every 12 hours       ? 11/14/21 1923  ?  ibuprofen (ADVIL) 600 MG tablet  Every 6 hours PRN       ? 11/14/21 1924  ? ?  ?  ? ?  ? ? ? ?Note:  This document was prepared using Dragon voice recognition software and may include unintentional dictation errors. ?  ?Concha Se, MD ?11/14/21 1925 ? ?

## 2021-11-14 NOTE — ED Triage Notes (Signed)
Patient c/o headache X3 days with no relief. No history headaches. Denies injury. Drove self to ER. Denies vision problems.  ?

## 2021-12-25 ENCOUNTER — Other Ambulatory Visit: Payer: Self-pay

## 2021-12-25 ENCOUNTER — Emergency Department
Admission: EM | Admit: 2021-12-25 | Discharge: 2021-12-25 | Disposition: A | Discharge status: Home / Self Care | Payer: Self-pay | Attending: Student in an Organized Health Care Education/Training Program | Admitting: Student in an Organized Health Care Education/Training Program

## 2021-12-25 ENCOUNTER — Emergency Department: Payer: Self-pay

## 2021-12-25 DIAGNOSIS — F17210 Nicotine dependence, cigarettes, uncomplicated: Secondary | ICD-10-CM | POA: Insufficient documentation

## 2021-12-25 DIAGNOSIS — J441 Chronic obstructive pulmonary disease with (acute) exacerbation: Secondary | ICD-10-CM | POA: Insufficient documentation

## 2021-12-25 DIAGNOSIS — J9801 Acute bronchospasm: Secondary | ICD-10-CM | POA: Insufficient documentation

## 2021-12-25 DIAGNOSIS — I10 Essential (primary) hypertension: Secondary | ICD-10-CM | POA: Insufficient documentation

## 2021-12-25 DIAGNOSIS — R0781 Pleurodynia: Secondary | ICD-10-CM

## 2021-12-25 MED ORDER — PREDNISONE 10 MG PO TABS: ORAL_TABLET | ORAL | 0 refills | Status: DC

## 2021-12-25 NOTE — ED Triage Notes (Signed)
Pt c/o cough with congestion with BL rib pain with breathing and movement for over a week. Pt is in NAD on arrival , respirations WNL  ?

## 2021-12-25 NOTE — ED Provider Notes (Signed)
? ?[facility] ?Provider Note ? ? ? Event Date/Time  ? First MD Initiated Contact with Patient 12/25/21 [phone]   ?  (approximate) ? ? ?History  ? ?URI ? ? ?HPI ? ?Cassandra Jennings is a 61 y.o. female presents to the ED with complaint of right lateral rib pain for 1 week.  Patient states that she has been coughing also for approximately 1 week but no fever, chills, nausea or vomiting.  Patient has been taking ibuprofen with some relief of her pain.  Patient has a history of hypertension and COPD.  Patient continues to smoke 1/2 to 1 pack cigarettes per day.  Currently she uses an albuterol inhaler and Advair to control her COPD.  She is unaware of any wheezing.  Patient also works at [employer] at [employer] and has been exposed to a great deal of pollen. ?  ? ? ?Physical Exam  ? ?Triage Vital Signs: ?ED Triage Vitals [12/25/21 0944]  ?Enc Vitals Group  ?   BP   ?   Pulse   ?   Resp   ?   Temp   ?   Temp src   ?   SpO2   ?   Weight 110 lb 14.3 oz (50.3 kg)  ?   Height 5\' 2"  (1.575 m)  ?   Head Circumference   ?   Peak Flow   ?   Pain Score   ?   Pain Loc   ?   Pain Edu?   ?   Excl. in [place]?   ? ? ?Most recent vital signs: ?Vitals:  ? 12/25/21 0953  ?BP: (!) 149/86  ?Pulse: 65  ?Resp: 18  ?Temp: 97.8 ?F (36.6 ?C)  ?SpO2: 100%  ? ? ? ?General: Awake, no distress.  Talkative and able to answer questions completely without any obvious shortness of breath.  ?CV:  Good peripheral perfusion.  Heart regular rate and rhythm. ?Resp:  Normal effort.  Lungs are clear bilaterally. ?Abd:  No distention.  ?Other:  Moderate tenderness on palpation of the right lateral sixth seventh eighth rib without deformity or skin discoloration.  No crepitus on auscultation. ? ? ?ED Results / Procedures / Treatments  ? ?Labs ?(all labs ordered are listed, but only abnormal results are displayed) ?Labs Reviewed - No data to display ? ? ?RADIOLOGY ?Chest x-ray images were reviewed by me independent of the radiologist  and was negative for infiltrate and no obvious rib fracture was noted.  Radiology report agrees with above and also no evidence of pneumothorax. ? ? ? ?PROCEDURES: ? ?Critical Care performed:  ? ?Procedures ? ? ?MEDICATIONS ORDERED IN ED: ?Medications - No data to display ? ? ?IMPRESSION / MDM / ASSESSMENT AND PLAN / ED COURSE  ?I reviewed the triage vital signs and the nursing notes. ? ? ?Differential diagnosis includes, but is not limited to, right rib fractures secondary to coughing, exacerbation of COPD, pneumonia, pneumothorax, bronchitis. ? ?61 year old female presents to the ED with complaint of right lateral rib pain.  Patient states that she has had a nonproductive cough that has worsened due to pollen exposure as she works at the [employer] at [employer].  She has a history of COPD and continues to use her albuterol and Advair but occasionally has to take prednisone during pollen season.  Patient is tender to palpation of the right lateral ribs without deformity.  Lungs are clear bilaterally.  X-ray was negative for fracture rib,  pneumonia or pneumothorax.  Patient was instructed to continue with her inhalers and we would add prednisone 30 mg once daily for 5 days.  She is to follow-up with her PCP if any continued problems.  Also she retired yesterday and will not have the exposure that she has had recently. ? ? ? ?  ? ? ?FINAL CLINICAL IMPRESSION(S) / ED DIAGNOSES  ? ?Final diagnoses:  ?Bronchospasm  ?Acute exacerbation of chronic obstructive pulmonary disease (COPD) ([place])  ?Rib pain on right side  ? ? ? ?Rx / DC Orders  ? ?ED Discharge Orders   ? ?      Ordered  ?  predniSONE (DELTASONE) 10 MG tablet       ? 12/25/21 1100  ? ?  ?  ? ?  ? ? ? ?Note:  This document was prepared using Dragon voice recognition software and may include unintentional dictation errors. ?  ?Johnn Hai, PA-C ?12/25/21 1110 ? ?  ?Merlyn Lot, MD ?12/25/21 1237 ? ?

## 2021-12-25 NOTE — ED Notes (Signed)
See triage note  presents with pain to bilateral rib areas  for several days  denies any fall pain is worse on the right ?

## 2021-12-25 NOTE — Discharge Instructions (Addendum)
Follow-up with your primary care provider if any continued problems or concerns.  Continue with your albuterol and Advair inhalers.  Decrease your exposure to pollen.  A prescription for prednisone was sent to the pharmacy to take 3 tablets once a day for the next 5 days. ?

## 2022-02-13 ENCOUNTER — Other Ambulatory Visit: Payer: Self-pay

## 2022-02-13 DIAGNOSIS — Z122 Encounter for screening for malignant neoplasm of respiratory organs: Secondary | ICD-10-CM

## 2022-02-13 DIAGNOSIS — F17210 Nicotine dependence, cigarettes, uncomplicated: Secondary | ICD-10-CM

## 2022-02-13 DIAGNOSIS — Z87891 Personal history of nicotine dependence: Secondary | ICD-10-CM

## 2022-02-19 ENCOUNTER — Encounter: Payer: Medicaid Other | Admitting: Acute Care

## 2022-02-25 ENCOUNTER — Ambulatory Visit: Payer: Medicaid Other

## 2022-03-13 ENCOUNTER — Ambulatory Visit
Admission: RE | Admit: 2022-03-13 | Discharge: 2022-03-13 | Disposition: A | Discharge status: Home / Self Care | Payer: Medicaid Other | Source: Ambulatory Visit | Attending: Acute Care | Admitting: Acute Care

## 2022-03-13 ENCOUNTER — Encounter: Payer: Medicaid Other | Admitting: Acute Care

## 2022-06-30 ENCOUNTER — Other Ambulatory Visit: Payer: Self-pay

## 2022-06-30 ENCOUNTER — Encounter: Payer: Self-pay | Admitting: Emergency Medicine

## 2022-06-30 ENCOUNTER — Emergency Department
Admission: EM | Admit: 2022-06-30 | Discharge: 2022-06-30 | Disposition: A | Discharge status: Home / Self Care | Payer: Medicaid Other | Attending: Emergency Medicine | Admitting: Emergency Medicine

## 2022-06-30 DIAGNOSIS — I10 Essential (primary) hypertension: Secondary | ICD-10-CM | POA: Insufficient documentation

## 2022-06-30 DIAGNOSIS — G8929 Other chronic pain: Secondary | ICD-10-CM

## 2022-06-30 DIAGNOSIS — X500XXA Overexertion from strenuous movement or load, initial encounter: Secondary | ICD-10-CM | POA: Insufficient documentation

## 2022-06-30 DIAGNOSIS — J449 Chronic obstructive pulmonary disease, unspecified: Secondary | ICD-10-CM | POA: Insufficient documentation

## 2022-06-30 DIAGNOSIS — M545 Low back pain, unspecified: Secondary | ICD-10-CM | POA: Insufficient documentation

## 2022-06-30 LAB — URINALYSIS, ROUTINE W REFLEX MICROSCOPIC
Bilirubin Urine: NEGATIVE
Glucose, UA: NEGATIVE mg/dL
Ketones, ur: NEGATIVE mg/dL
Nitrite: NEGATIVE
Protein, ur: NEGATIVE mg/dL
Specific Gravity, Urine: 1.02 (ref 1.005–1.030)
pH: 6 (ref 5.0–8.0)

## 2022-06-30 MED ORDER — IBUPROFEN 600 MG PO TABS: 600.0000 mg | ORAL_TABLET | Freq: Three times a day (TID) | ORAL | 0 refills | Status: DC | PRN

## 2022-06-30 NOTE — Discharge Instructions (Signed)
Your appointment with your primary care provider as already scheduled.  You may use ice or heat to your back as needed for discomfort.  Also the over-the-counter pain patches can be used.  A prescription for ibuprofen was sent to the pharmacy and to you can see your primary care provider.

## 2022-06-30 NOTE — ED Provider Notes (Signed)
[facility] Provider Note    Event Date/Time   First MD Initiated Contact with Patient 06/30/22 1341     (approximate)   History   Back Pain   HPI  Cassandra Jennings is a 61 y.o. female   presents to the ED with complaint of low back pain.  Patient states she is unsure if she has pulled a muscle or has a urinary tract infection.  Patient reports that this morning at work she did a lot of heavy lifting, mopping the floors at work, and also swept the parking lot from leaves.  She denies any urinary symptoms.  Has a history of hypertension, COPD, hepatitis and anxiety.      Physical Exam   Triage Vital Signs: ED Triage Vitals  Enc Vitals Group     BP 06/30/22 1234 (!) 158/78     Pulse Rate 06/30/22 1234 (!) 57     Resp 06/30/22 1234 18     Temp 06/30/22 1234 98.3 F (36.8 C)     Temp Source 06/30/22 1234 Oral     SpO2 06/30/22 1234 94 %     Weight 06/30/22 1226 108 lb 0.4 oz (49 kg)     Height 06/30/22 1226 5\' 2"  (1.575 m)     Head Circumference --      Peak Flow --      Pain Score 06/30/22 1226 10     Pain Loc --      Pain Edu? --      Excl. in [place]? --     Most recent vital signs: Vitals:   06/30/22 1234  BP: (!) 158/78  Pulse: (!) 57  Resp: 18  Temp: 98.3 F (36.8 C)  SpO2: 94%     General: Awake, no distress.  CV:  Good peripheral perfusion.  Resp:  Normal effort.  Abd:  No distention.  Soft, flat, nontender. Other:  Neurolyse tenderness on palpation of the lower lumbar spine without evidence of injury, abrasions or skin discoloration.  Good muscle strength bilaterally at 5/5, reflexes are 2+ bilaterally.  Patient is able to stand and ambulate without any assistance.  No point tenderness on palpation of the lumbar spine.   ED Results / Procedures / Treatments   Labs (all labs ordered are listed, but only abnormal results are displayed) Labs Reviewed  URINALYSIS, ROUTINE W REFLEX MICROSCOPIC - Abnormal; Notable for the following  components:      Result Value   Color, Urine YELLOW (*)    APPearance HAZY (*)    Hgb urine dipstick SMALL (*)    Leukocytes,Ua SMALL (*)    Bacteria, UA FEW (*)    All other components within normal limits    PROCEDURES:  Critical Care performed:   Procedures   MEDICATIONS ORDERED IN ED: Medications - No data to display   IMPRESSION / MDM / [place] / ED COURSE  I reviewed the triage vital signs and the nursing notes.   Differential diagnosis includes, but is not limited to, muscle skeletal strain, chronic low back pain, urinary tract infection, kidney stone.  61 year old female presents to the ED with complaint of low back pain and a history of the same.  Patient states that this morning at work she did a lot of heavy lifting, mopping the floor and swept the parking lot before noticing that her back was hurting.  Urinalysis was clear and reassuring and patient was made aware.  Patient has an appointment  with her PCP on November 1 and states that she will talk to him about long-term medication at that time.  She reports that ibuprofen 600 mg is the only thing that helps her back.  She also is using over-the-counter lidocaine patches.  A prescription for ibuprofen 600 mg 3 times daily with food #15 was sent to the pharmacy and patient is to talk to her PCP about her back complaints.      Patient's presentation is most consistent with acute complicated illness / injury requiring diagnostic workup.  FINAL CLINICAL IMPRESSION(S) / ED DIAGNOSES   Final diagnoses:  Acute exacerbation of chronic low back pain     Rx / DC Orders   ED Discharge Orders          Ordered    ibuprofen (ADVIL) 600 MG tablet  Every 8 hours PRN        06/30/22 1437             Note:  This document was prepared using Dragon voice recognition software and may include unintentional dictation errors.   Tommi Rumps, PA-C 06/30/22 1445    Chesley Noon, MD 06/30/22  1859

## 2022-06-30 NOTE — ED Triage Notes (Signed)
Pt reports lower back pain. Pt states reports not sure if she pulled a muscle or has a urinary tract infection

## 2022-07-04 IMAGING — MG DIGITAL SCREENING BILAT W/ TOMO W/ CAD
6 of 10 series · 6 of 30 positions shown · non-contrast
Comparison: None.

CLINICAL DATA: Screening.

EXAM:
DIGITAL SCREENING BILATERAL MAMMOGRAM WITH TOMO AND CAD

[L CC synth-2D]
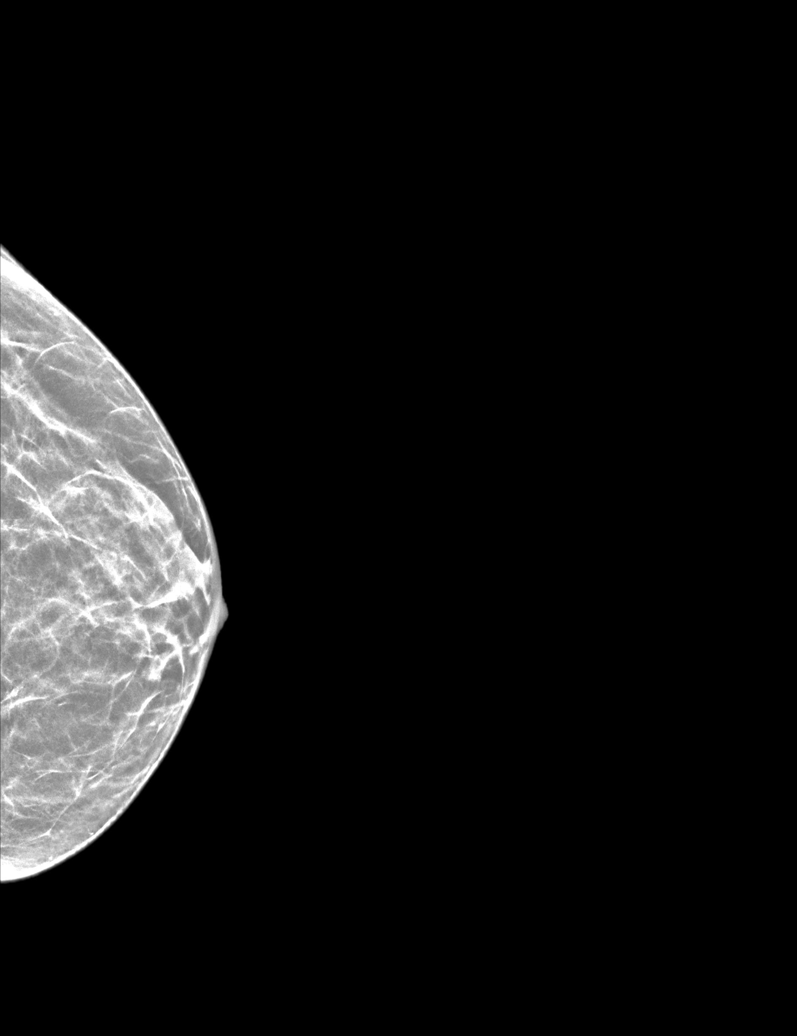

[R MLO synth-2D (1 of 2)]
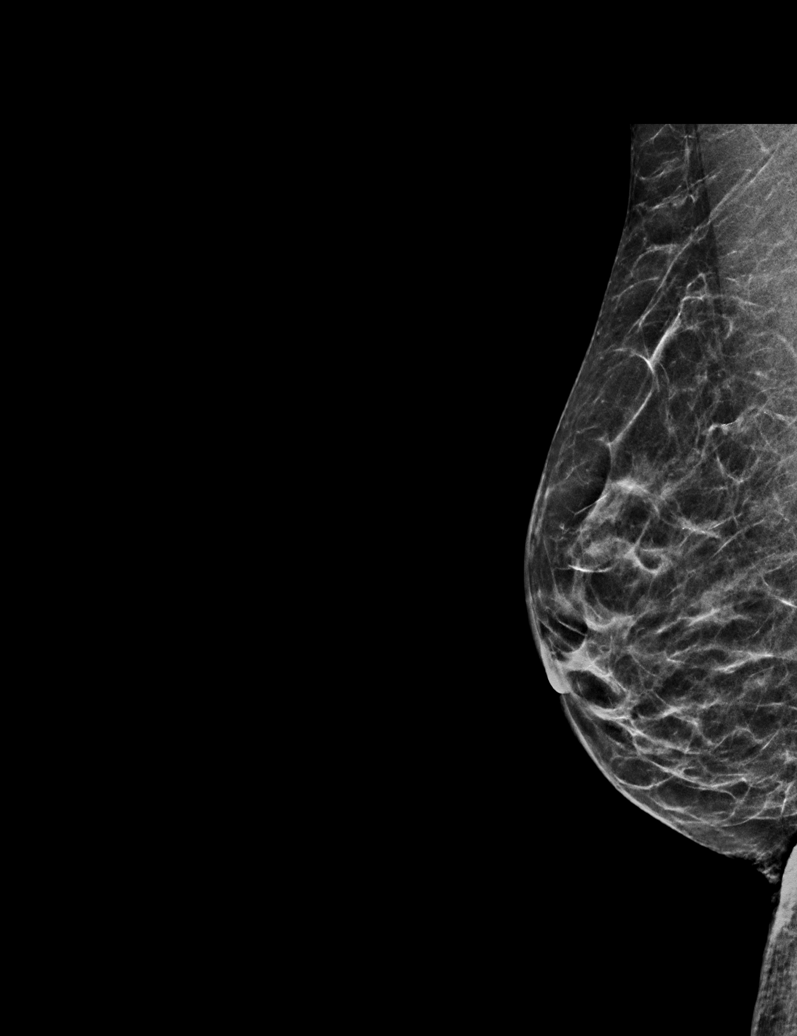

[L MLO synth-2D]
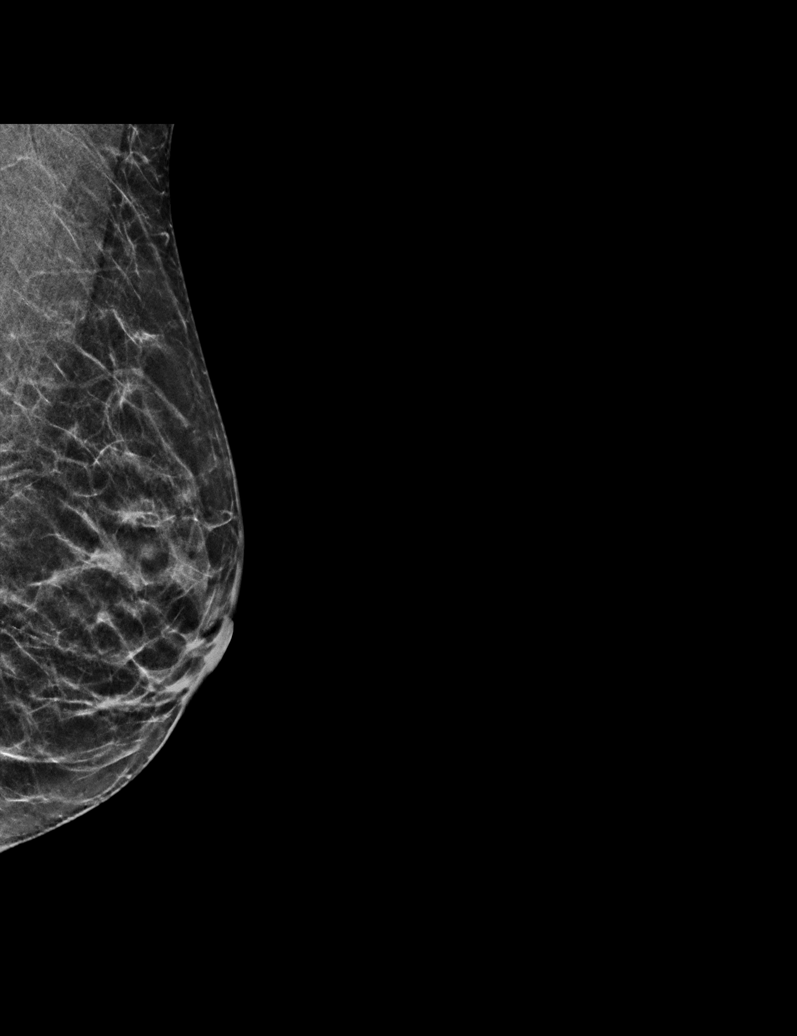

[R CC synth-2D]
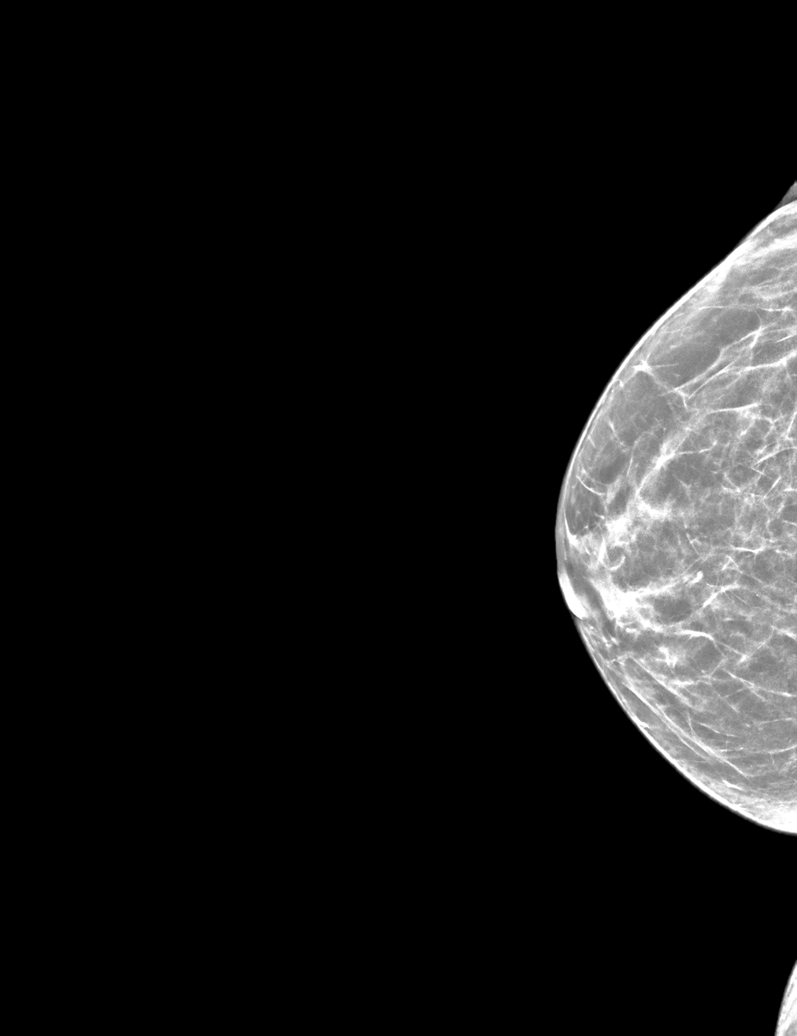

[R MLO synth-2D (2 of 2)]
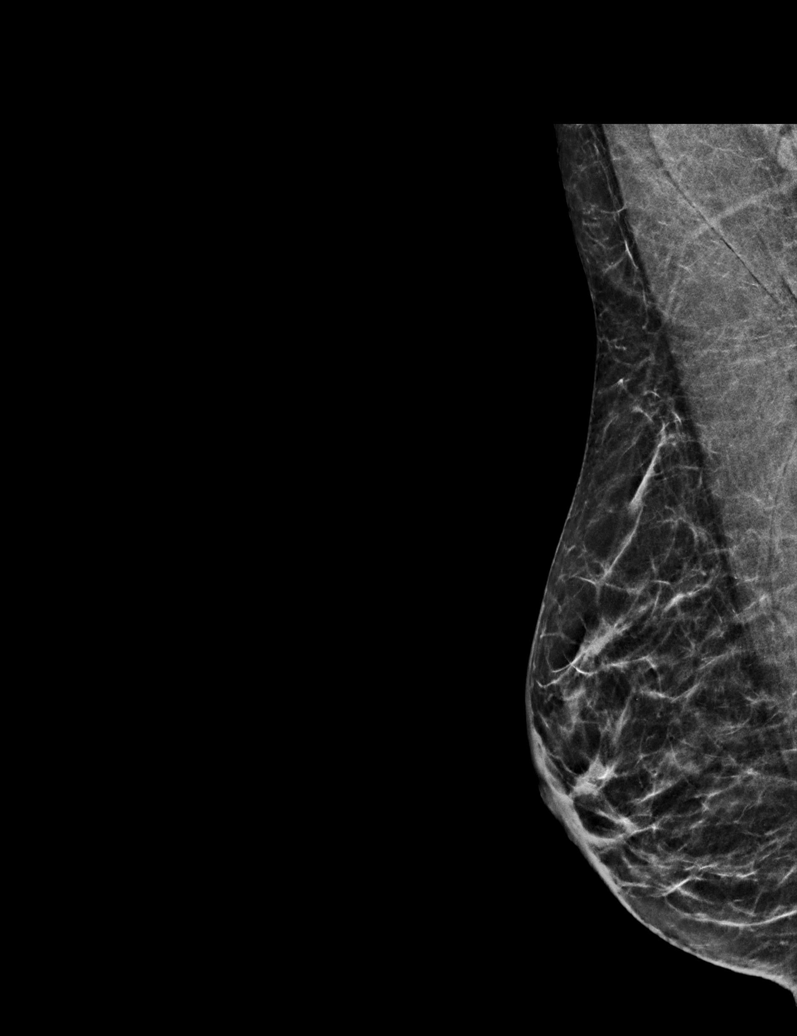

[R MLO tomo · tomo slice 23/46.0]
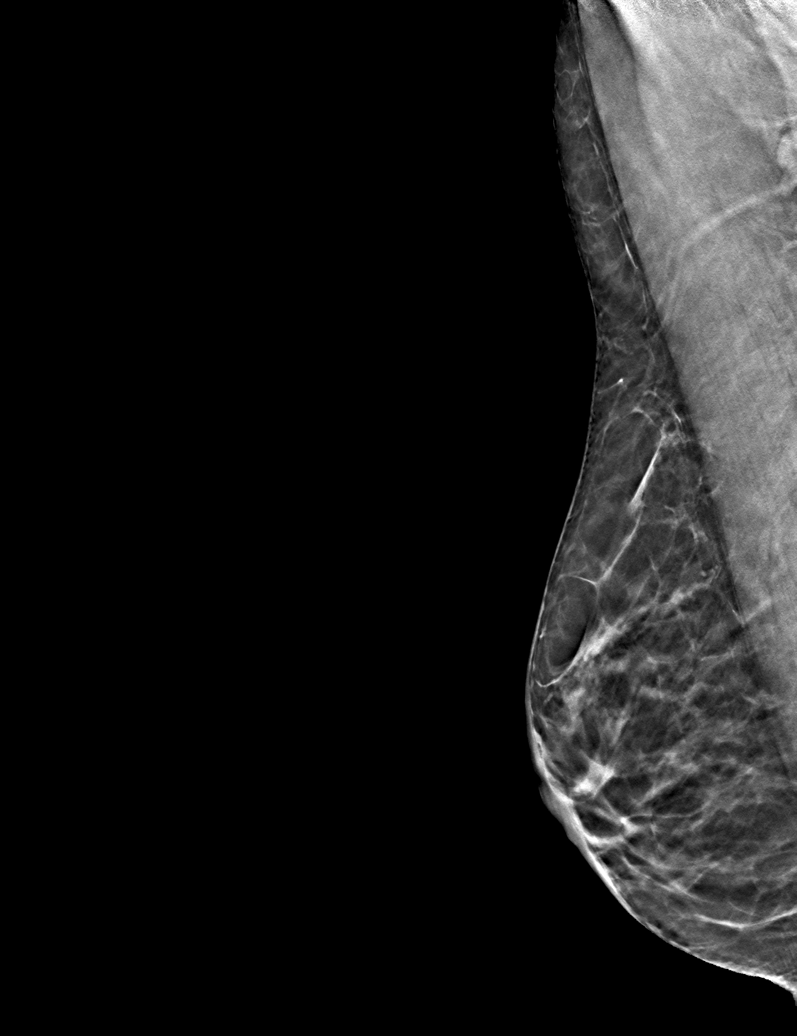

[6 of 30 positions shown; findings below may reference images not displayed]

ACR Breast Density Category b: There are scattered areas of
fibroglandular density.
FINDINGS: In the left breast, a possible asymmetry warrants further
evaluation. In the right breast, no findings suspicious for
malignancy. Images were processed with CAD.
IMPRESSION: Further evaluation is suggested for possible asymmetry in the left
breast.

RECOMMENDATION:
Diagnostic mammogram and possibly ultrasound of the left breast.
(Code:[id number]-[id number])

The patient will be contacted regarding the findings, and additional
imaging will be scheduled.

BI-RADS CATEGORY  0: Incomplete. Need additional imaging evaluation
and/or prior mammograms for comparison.

## 2022-08-31 ENCOUNTER — Other Ambulatory Visit: Payer: Self-pay

## 2022-08-31 ENCOUNTER — Emergency Department
Admission: EM | Admit: 2022-08-31 | Discharge: 2022-08-31 | Disposition: A | Discharge status: Home / Self Care | Payer: Medicaid Other | Attending: Emergency Medicine | Admitting: Emergency Medicine

## 2022-08-31 DIAGNOSIS — J449 Chronic obstructive pulmonary disease, unspecified: Secondary | ICD-10-CM | POA: Diagnosis not present

## 2022-08-31 DIAGNOSIS — H7291 Unspecified perforation of tympanic membrane, right ear: Secondary | ICD-10-CM | POA: Diagnosis not present

## 2022-08-31 DIAGNOSIS — I10 Essential (primary) hypertension: Secondary | ICD-10-CM | POA: Diagnosis not present

## 2022-08-31 DIAGNOSIS — H9201 Otalgia, right ear: Secondary | ICD-10-CM | POA: Diagnosis present

## 2022-08-31 MED ORDER — CLINDAMYCIN HCL 150 MG PO CAPS: 300.0000 mg | ORAL_CAPSULE | Freq: Three times a day (TID) | ORAL | 0 refills | Status: DC

## 2022-08-31 MED ORDER — OFLOXACIN 0.3 % OT SOLN: 5.0000 [drp] | Freq: Every day | OTIC | 0 refills | Status: DC

## 2022-08-31 NOTE — Discharge Instructions (Addendum)
Do ot get water or put any objects in your ear Take your medication as prescribed Return if worsening

## 2022-08-31 NOTE — ED Triage Notes (Signed)
Pt called 911 for bleeding out of the right ear. Started this morning. She went onto work. Pt does admit to cleaning her ears after her shower this morning with a Qtip. She feels like something is stuck in her ear. Pt is in pain in right ear. Pt ambulatory to triage. Pt CAOx4 and in no acute distress.

## 2022-08-31 NOTE — ED Provider Notes (Signed)
[facility] Provider Note    Event Date/Time   First MD Initiated Contact with Patient 08/31/22 1546     (approximate)   History   Ear Drainage and Otalgia   HPI Cassandra Jennings is a 61 y.o. female with history of hypertension, hepatitis C, COPD presents emergency department with right ear drainage.  Patient was cleaning her ear with Q-tips noted bleeding a little while later along with some clear fluid.  No other injury reported.  No fever or chills      Physical Exam   Triage Vital Signs: ED Triage Vitals  Enc Vitals Group     BP 08/31/22 1507 (!) 146/83     Pulse Rate 08/31/22 1507 75     Resp 08/31/22 1507 16     Temp 08/31/22 1507 97.7 F (36.5 C)     Temp Source 08/31/22 1507 Oral     SpO2 08/31/22 1507 98 %     Weight 08/31/22 1505 113 lb (51.3 kg)     Height 08/31/22 1505 5\' 2"  (1.575 m)     Head Circumference --      Peak Flow --      Pain Score 08/31/22 1504 6     Pain Loc --      Pain Edu? --      Excl. in GC? --     Most recent vital signs: Vitals:   08/31/22 1507  BP: (!) 146/83  Pulse: 75  Resp: 16  Temp: 97.7 F (36.5 C)  SpO2: 98%     General: Awake, no distress.   CV:  Good peripheral perfusion. regular rate and  rhythm Resp:  Normal effort. Lungs cta Abd:  No distention.   Other:  ENT: right ear with blood and clear fluid in ear canal, left tm wnl, neck supple no lymph    ED Results / Procedures / Treatments   Labs (all labs ordered are listed, but only abnormal results are displayed) Labs Reviewed - No data to display   EKG     RADIOLOGY     PROCEDURES:   Procedures   MEDICATIONS ORDERED IN ED: Medications - No data to display   IMPRESSION / MDM / ASSESSMENT AND PLAN / ED COURSE  I reviewed the triage vital signs and the nursing notes.                              Differential diagnosis includes, but is not limited to, ruptured tympanic membrane, normal body, for infection, canal  abrasion  Patient's presentation is most consistent with acute, uncomplicated illness.  Patient's exam is consistent with tympanic membrane rupture.  She was given a prescription for Floxin otic drops along with clindamycin to prevent infection.  She is to follow-up with her regular doctor and ENT for recheck.  She is in agreement treatment plan.  We did discuss not getting any fluids or putting anything else in the ear other than the drops.  She can also use a cottonball after putting the drops in the.  She is in agreement treatment plan.  Discharged stable condition.      FINAL CLINICAL IMPRESSION(S) / ED DIAGNOSES   Final diagnoses:  Tympanic membrane rupture, right     Rx / DC Orders   ED Discharge Orders          Ordered    clindamycin (CLEOCIN) 150 MG capsule  3 times daily  08/31/22 1548    ofloxacin (FLOXIN) 0.3 % OTIC solution  Daily        08/31/22 1548             Note:  This document was prepared using Dragon voice recognition software and may include unintentional dictation errors.    Faythe Ghee, PA-C 08/31/22 1558    Sharman Cheek, MD 08/31/22 [phone]

## 2022-09-12 ENCOUNTER — Other Ambulatory Visit: Payer: Self-pay

## 2022-09-12 DIAGNOSIS — N6489 Other specified disorders of breast: Secondary | ICD-10-CM

## 2022-09-16 ENCOUNTER — Other Ambulatory Visit: Payer: Medicaid Other

## 2022-09-16 ENCOUNTER — Ambulatory Visit: Payer: Medicaid Other

## 2023-02-12 ENCOUNTER — Other Ambulatory Visit: Payer: Self-pay | Admitting: Nurse Practitioner

## 2023-02-12 ENCOUNTER — Ambulatory Visit
Admission: RE | Admit: 2023-02-12 | Discharge: 2023-02-12 | Disposition: A | Discharge status: Home / Self Care | Payer: Medicaid Other | Source: Ambulatory Visit | Attending: Nurse Practitioner | Admitting: Nurse Practitioner

## 2023-02-12 ENCOUNTER — Emergency Department
Admission: EM | Admit: 2023-02-12 | Discharge: 2023-02-12 | Discharge status: Absent without leave | Payer: Medicaid Other

## 2023-02-12 DIAGNOSIS — M25552 Pain in left hip: Secondary | ICD-10-CM

## 2023-03-17 ENCOUNTER — Observation Stay
Admission: EM | Admit: 2023-03-17 | Discharge: 2023-03-18 | Disposition: A | Discharge status: Home / Self Care | Payer: MEDICAID | Attending: General Surgery | Admitting: General Surgery

## 2023-03-17 ENCOUNTER — Emergency Department: Payer: MEDICAID

## 2023-03-17 ENCOUNTER — Encounter
Admission: EM | Disposition: A | Discharge status: Home / Self Care | Payer: Self-pay | Source: Home / Self Care | Attending: Emergency Medicine

## 2023-03-17 ENCOUNTER — Other Ambulatory Visit: Payer: Self-pay

## 2023-03-17 DIAGNOSIS — K3580 Unspecified acute appendicitis: Principal | ICD-10-CM

## 2023-03-17 DIAGNOSIS — R1031 Right lower quadrant pain: Secondary | ICD-10-CM | POA: Diagnosis present

## 2023-03-17 DIAGNOSIS — I10 Essential (primary) hypertension: Secondary | ICD-10-CM | POA: Diagnosis not present

## 2023-03-17 DIAGNOSIS — F17210 Nicotine dependence, cigarettes, uncomplicated: Secondary | ICD-10-CM | POA: Insufficient documentation

## 2023-03-17 DIAGNOSIS — Z79899 Other long term (current) drug therapy: Secondary | ICD-10-CM | POA: Insufficient documentation

## 2023-03-17 DIAGNOSIS — K353 Acute appendicitis with localized peritonitis, without perforation or gangrene: Secondary | ICD-10-CM | POA: Diagnosis not present

## 2023-03-17 DIAGNOSIS — J449 Chronic obstructive pulmonary disease, unspecified: Secondary | ICD-10-CM | POA: Insufficient documentation

## 2023-03-17 HISTORY — PX: XI ROBOTIC LAPAROSCOPIC ASSISTED APPENDECTOMY: SHX6877

## 2023-03-17 LAB — CBC
HCT: 41.4 % (ref 36.0–46.0)
Hemoglobin: 14.1 g/dL (ref 12.0–15.0)
MCH: 29.8 pg (ref 26.0–34.0)
MCHC: 34.1 g/dL (ref 30.0–36.0)
MCV: 87.5 fL (ref 80.0–100.0)
Platelets: 270 10*3/uL (ref 150–400)
RBC: 4.73 MIL/uL (ref 3.87–5.11)
RDW: 11.7 % (ref 11.5–15.5)
WBC: 10.7 10*3/uL — ABNORMAL HIGH (ref 4.0–10.5)
nRBC: 0 % (ref 0.0–0.2)

## 2023-03-17 LAB — COMPREHENSIVE METABOLIC PANEL
ALT: 10 U/L (ref 0–44)
AST: 18 U/L (ref 15–41)
Albumin: 4 g/dL (ref 3.5–5.0)
Alkaline Phosphatase: 59 U/L (ref 38–126)
Anion gap: 9 (ref 5–15)
BUN: 11 mg/dL (ref 8–23)
CO2: 27 mmol/L (ref 22–32)
Calcium: 9.1 mg/dL (ref 8.9–10.3)
Chloride: 101 mmol/L (ref 98–111)
Creatinine, Ser: 0.71 mg/dL (ref 0.44–1.00)
GFR, Estimated: >60 mL/min (ref >60)
Glucose, Bld: 122 mg/dL — ABNORMAL HIGH (ref 70–99)
Potassium: 3.2 mmol/L — ABNORMAL LOW (ref 3.5–5.1)
Sodium: 137 mmol/L (ref 135–145)
Total Bilirubin: 1.2 mg/dL (ref 0.3–1.2)
Total Protein: 7.7 g/dL (ref 6.5–8.1)

## 2023-03-17 LAB — URINALYSIS, ROUTINE W REFLEX MICROSCOPIC
Bilirubin Urine: NEGATIVE
Glucose, UA: NEGATIVE mg/dL
Ketones, ur: NEGATIVE mg/dL
Leukocytes,Ua: NEGATIVE
Nitrite: NEGATIVE
Protein, ur: NEGATIVE mg/dL
Specific Gravity, Urine: 1.008 (ref 1.005–1.030)
pH: 6 (ref 5.0–8.0)

## 2023-03-17 LAB — LIPASE, BLOOD: Lipase: 45 U/L (ref 11–51)

## 2023-03-17 SURGERY — APPENDECTOMY, ROBOT-ASSISTED, LAPAROSCOPIC
Anesthesia: General

## 2023-03-17 MED ORDER — HYDROCODONE-ACETAMINOPHEN 5-325 MG PO TABS
1.0000 | ORAL_TABLET | ORAL | Status: DC | PRN
  Administered 2023-03-18: 1 via ORAL
  Filled 2023-03-17: qty 1

## 2023-03-17 MED ORDER — MOMETASONE FURO-FORMOTEROL FUM 200-5 MCG/ACT IN AERO
2.0000 | INHALATION_SPRAY | Freq: Two times a day (BID) | RESPIRATORY_TRACT | Status: DC
  Administered 2023-03-18: 2 via RESPIRATORY_TRACT
  Filled 2023-03-17: qty 8.8

## 2023-03-17 MED ORDER — IOHEXOL 300 MG/ML  SOLN
100.0000 mL | Freq: Once | INTRAMUSCULAR | Status: AC | PRN
  Administered 2023-03-17: 100 mL via INTRAVENOUS

## 2023-03-17 MED ORDER — ENOXAPARIN SODIUM 40 MG/0.4ML IJ SOSY: 40.0000 mg | PREFILLED_SYRINGE | INTRAMUSCULAR | Status: DC

## 2023-03-17 MED ORDER — ACETAMINOPHEN 650 MG RE SUPP: 650.0000 mg | Freq: Four times a day (QID) | RECTAL | Status: DC | PRN

## 2023-03-17 MED ORDER — ONDANSETRON HCL 4 MG/2ML IJ SOLN: 4.0000 mg | Freq: Four times a day (QID) | INTRAMUSCULAR | Status: DC | PRN

## 2023-03-17 MED ORDER — ALBUTEROL SULFATE (2.5 MG/3ML) 0.083% IN NEBU: 3.0000 mL | INHALATION_SOLUTION | Freq: Four times a day (QID) | RESPIRATORY_TRACT | Status: DC | PRN

## 2023-03-17 MED ORDER — ONDANSETRON HCL 4 MG/2ML IJ SOLN
4.0000 mg | Freq: Once | INTRAMUSCULAR | Status: AC
  Administered 2023-03-17: 4 mg via INTRAVENOUS
  Filled 2023-03-17: qty 2

## 2023-03-17 MED ORDER — METRONIDAZOLE 500 MG/100ML IV SOLN
500.0000 mg | Freq: Two times a day (BID) | INTRAVENOUS | Status: DC
  Administered 2023-03-17 – 2023-03-18 (×2): 500 mg via INTRAVENOUS
  Filled 2023-03-17 (×3): qty 100

## 2023-03-17 MED ORDER — PANTOPRAZOLE SODIUM 40 MG PO TBEC
40.0000 mg | DELAYED_RELEASE_TABLET | Freq: Every day | ORAL | Status: DC
  Administered 2023-03-18 (×2): 40 mg via ORAL
  Filled 2023-03-17: qty 1

## 2023-03-17 MED ORDER — LISINOPRIL 10 MG PO TABS
20.0000 mg | ORAL_TABLET | Freq: Every day | ORAL | Status: DC
  Administered 2023-03-18: 20 mg via ORAL
  Filled 2023-03-17: qty 2

## 2023-03-17 MED ORDER — CIPROFLOXACIN IN D5W 400 MG/200ML IV SOLN
400.0000 mg | Freq: Two times a day (BID) | INTRAVENOUS | Status: DC
  Administered 2023-03-18 (×2): 400 mg via INTRAVENOUS
  Filled 2023-03-17 (×3): qty 200

## 2023-03-17 MED ORDER — ACETAMINOPHEN 325 MG PO TABS: 650.0000 mg | ORAL_TABLET | Freq: Four times a day (QID) | ORAL | Status: DC | PRN

## 2023-03-17 MED ORDER — BUPIVACAINE-EPINEPHRINE (PF) 0.5% -1:200000 IJ SOLN
INTRAMUSCULAR | Status: AC
  Filled 2023-03-17: qty 30

## 2023-03-17 MED ORDER — SODIUM CHLORIDE 0.9 % IV SOLN: INTRAVENOUS | Status: DC

## 2023-03-17 MED ORDER — ONDANSETRON 4 MG PO TBDP: 4.0000 mg | ORAL_TABLET | Freq: Four times a day (QID) | ORAL | Status: DC | PRN

## 2023-03-17 MED ORDER — MORPHINE SULFATE (PF) 4 MG/ML IV SOLN
4.0000 mg | Freq: Once | INTRAVENOUS | Status: AC
  Administered 2023-03-17: 4 mg via INTRAVENOUS
  Filled 2023-03-17: qty 1

## 2023-03-17 SURGICAL SUPPLY — 63 items
ADH SKN CLS APL DERMABOND .7 (GAUZE/BANDAGES/DRESSINGS) ×1
ADH SKN CLS LQ APL DERMABOND (GAUZE/BANDAGES/DRESSINGS) ×1
BAG PRESSURE INF REUSE 1000 (BAG) IMPLANT
BLADE SURG SZ11 CARB STEEL (BLADE) ×1 IMPLANT
CANNULA REDUCER 12-8 DVNC XI (CANNULA) ×1 IMPLANT
COVER TIP SHEARS 8 DVNC (MISCELLANEOUS) ×1 IMPLANT
DERMABOND ADVANCED .7 DNX12 (GAUZE/BANDAGES/DRESSINGS) ×1 IMPLANT
DERMABOND ADVANCED .7 DNX6 (GAUZE/BANDAGES/DRESSINGS) IMPLANT
DRAPE ARM DVNC X/XI (DISPOSABLE) ×4 IMPLANT
DRAPE COLUMN DVNC XI (DISPOSABLE) ×1 IMPLANT
ELECT REM PT RETURN 9FT ADLT (ELECTROSURGICAL) ×1
ELECTRODE REM PT RTRN 9FT ADLT (ELECTROSURGICAL) ×1 IMPLANT
FORCEPS BPLR FENES DVNC XI (FORCEP) ×1 IMPLANT
GLOVE BIOGEL PI IND STRL 6.5 (GLOVE) ×2 IMPLANT
GLOVE SURG SYN 6.5 ES PF (GLOVE) ×3 IMPLANT
GLOVE SURG SYN 6.5 PF PI (GLOVE) ×2 IMPLANT
GOWN STRL REUS W/ TWL LRG LVL3 (GOWN DISPOSABLE) ×3 IMPLANT
GOWN STRL REUS W/TWL LRG LVL3 (GOWN DISPOSABLE) ×3
GRASPER SUT TROCAR 14GX15 (MISCELLANEOUS) IMPLANT
GRASPER TIP-UP FEN DVNC XI (INSTRUMENTS) ×1 IMPLANT
IRRIGATOR SUCT 8 DISP DVNC XI (IRRIGATION / IRRIGATOR) IMPLANT
IV NS 1000ML (IV SOLUTION) ×1
IV NS 1000ML BAXH (IV SOLUTION) IMPLANT
KIT PINK PAD W/HEAD ARE REST (MISCELLANEOUS) ×1 IMPLANT
KIT PINK PAD W/HEAD ARM REST (MISCELLANEOUS) ×1 IMPLANT
LABEL OR SOLS (LABEL) IMPLANT
MANIFOLD NEPTUNE II (INSTRUMENTS) ×1 IMPLANT
NDL DRIVE SUT CUT DVNC (INSTRUMENTS) ×1 IMPLANT
NDL HYPO 22X1.5 SAFETY MO (MISCELLANEOUS) ×1 IMPLANT
NDL INSUFFLATION 14GA 120MM (NEEDLE) ×1 IMPLANT
NEEDLE DRIVE SUT CUT DVNC (INSTRUMENTS) ×1 IMPLANT
NEEDLE HYPO 22X1.5 SAFETY MO (MISCELLANEOUS) ×1 IMPLANT
NEEDLE INSUFFLATION 14GA 120MM (NEEDLE) ×1 IMPLANT
OBTURATOR OPTICAL STND 8 DVNC (TROCAR) ×1
OBTURATOR OPTICALSTD 8 DVNC (TROCAR) ×1 IMPLANT
PACK LAP CHOLECYSTECTOMY (MISCELLANEOUS) ×1 IMPLANT
RELOAD STAPLE 45 2.5 WHT DVNC (STAPLE) IMPLANT
RELOAD STAPLE 45 3.5 BLU DVNC (STAPLE) IMPLANT
RELOAD STAPLER 2.5X45 WHT DVNC (STAPLE) IMPLANT
RELOAD STAPLER 3.5X45 BLU DVNC (STAPLE) IMPLANT
SCISSORS MNPLR CVD DVNC XI (INSTRUMENTS) ×1 IMPLANT
SEAL UNIV 5-12 XI (MISCELLANEOUS) ×4 IMPLANT
SEALER VESSEL EXT DVNC XI (MISCELLANEOUS) IMPLANT
SET TUBE SMOKE EVAC HIGH FLOW (TUBING) ×1 IMPLANT
SLEEVE SCD COMPRESS KNEE MED (STOCKING) IMPLANT
SOL ELECTROSURG ANTI STICK (MISCELLANEOUS) ×1
SOLUTION ELECTROSURG ANTI STCK (MISCELLANEOUS) ×1 IMPLANT
SPONGE T-LAP 4X18 ~~LOC~~+RFID (SPONGE) ×1 IMPLANT
STAPLER 45 SUREFORM DVNC (STAPLE) IMPLANT
STAPLER RELOAD 2.5X45 WHT DVNC (STAPLE)
STAPLER RELOAD 3.5X45 BLU DVNC (STAPLE)
SUT MNCRL AB 4-0 PS2 18 (SUTURE) ×1 IMPLANT
SUT VIC AB 2-0 SH 27 (SUTURE) ×1
SUT VIC AB 2-0 SH 27XBRD (SUTURE) ×1 IMPLANT
SUT VIC AB 4-0 FS2 27 (SUTURE) IMPLANT
SUT VICRYL 0 UR6 27IN ABS (SUTURE) ×1 IMPLANT
SUT VLOC 90 6 CV-15 VIOLET (SUTURE) ×1 IMPLANT
SYR 30ML LL (SYRINGE) ×1 IMPLANT
SYS BAG RETRIEVAL 10MM (BASKET) ×1
SYSTEM BAG RETRIEVAL 10MM (BASKET) ×1 IMPLANT
TRAP FLUID SMOKE EVACUATOR (MISCELLANEOUS) ×1 IMPLANT
TRAY FOLEY MTR SLVR 16FR STAT (SET/KITS/TRAYS/PACK) IMPLANT
WATER STERILE IRR 500ML POUR (IV SOLUTION) ×1 IMPLANT

## 2023-03-17 NOTE — H&P (Signed)
SURGICAL CONSULTATION NOTE   HISTORY OF PRESENT ILLNESS (HPI):  62 y.o. female presented to [facility] ED for evaluation of abdominal pain. Patient reports starting and having abdominal pain since this morning.  Pain started in the paramedical area and now radiates to the right upper quadrant.  Aggravating factor is palpating the right lower quadrant.  Elevating factor is premedication in the ED.  At the ED she was found with mild leukocytosis.  There is tenderness on palpation in the right upper quadrant.  CT scan of the abdomen and pelvis shows acute appendicitis without perforation.  I personally evaluated the images.  Surgery is consulted by Dr. Cyril Loosen in this context for evaluation and management of acute appendicitis.  PAST MEDICAL HISTORY (PMH):  Past Medical History:  Diagnosis Date   Anxiety    COPD (chronic obstructive pulmonary disease) (HCC)    Hepatitis    Hypertension      PAST SURGICAL HISTORY (PSH):  Past Surgical History:  Procedure Laterality Date   DILATION AND CURETTAGE OF UTERUS       MEDICATIONS:  Prior to Admission medications   Medication Sig Start Date End Date Taking? Authorizing Provider  albuterol (PROVENTIL HFA;VENTOLIN HFA) 108 (90 Base) MCG/ACT inhaler Inhale 2 puffs into the lungs every 6 (six) hours as needed for wheezing or shortness of breath. 01/14/18   Fisher, Roselyn Bering, PA-C  clindamycin (CLEOCIN) 150 MG capsule Take 2 capsules (300 mg total) by mouth 3 (three) times daily. 08/31/22   Sherrie Mustache Roselyn Bering, PA-C  fluticasone-salmeterol (ADVAIR HFA) [phone] MCG/ACT inhaler Inhale 2 puffs into the lungs 2 (two) times daily. 01/14/18   Fisher, Roselyn Bering, PA-C  hydrOXYzine (ATARAX/VISTARIL) 25 MG tablet Take 25 mg by mouth 3 (three) times daily as needed.    [provider]  ibuprofen (ADVIL) 600 MG tablet Take 1 tablet (600 mg total) by mouth every 8 (eight) hours as needed. With food 06/30/22   Tommi Rumps, PA-C  lisinopril (PRINIVIL,ZESTRIL) 20 MG  tablet Take 20 mg by mouth daily.    [provider]  ofloxacin (FLOXIN) 0.3 % OTIC solution Place 5 drops into the right ear daily. Discard remainder 08/31/22   Sherrie Mustache Roselyn Bering, PA-C  pantoprazole (PROTONIX) 40 MG tablet Take 40 mg by mouth daily.    [provider]     ALLERGIES:  Allergies  Allergen Reactions   Penicillins Anaphylaxis   Codeine      SOCIAL HISTORY:  Social History   Socioeconomic History   Marital status: Single    Spouse name: Not on file   Number of children: Not on file   Years of education: Not on file   Highest education level: Not on file  Occupational History   Not on file  Tobacco Use   Smoking status: Every Day    Types: Cigarettes   Smokeless tobacco: Never  Substance and Sexual Activity   Alcohol use: Yes    Alcohol/week: 3.0 standard drinks of alcohol    Types: 1 Glasses of wine, 1 Cans of beer, 1 Shots of liquor per week   Drug use: Yes    Types: Marijuana   Sexual activity: Yes  Other Topics Concern   Not on file  Social History Narrative   Not on file   Social Determinants of Health   Financial Resource Strain: Not on file  Food Insecurity: Not on file  Transportation Needs: Not on file  Physical Activity: Not on file  Stress: Not on file  Social Connections: Not on file  Intimate Partner Violence: Not on file      FAMILY HISTORY:  Family History  Problem Relation Age of Onset   Breast cancer Maternal Great-grandmother      REVIEW OF SYSTEMS:  Constitutional: denies weight loss, fever, chills, or sweats  Eyes: denies any other vision changes, history of eye injury  ENT: denies sore throat, hearing problems  Respiratory: denies shortness of breath, wheezing  Cardiovascular: denies chest pain, palpitations  Gastrointestinal: positive abdominal pain Genitourinary: denies burning with urination or urinary frequency Musculoskeletal: denies any other joint pains or cramps  Skin: denies any other rashes or  skin discolorations  Neurological: denies any other headache, dizziness, weakness  Psychiatric: denies any other depression, anxiety   All other review of systems were negative   VITAL SIGNS:  Temp:  [98.9 F (37.2 C)] 98.9 F (37.2 C) (07/15 1752) Pulse Rate:  [79] 79 (07/15 1752) Resp:  [18] 18 (07/15 1752) BP: (127)/(77) 127/77 (07/15 1752) SpO2:  [95 %] 95 % (07/15 1752)             INTAKE/OUTPUT:  This shift: No intake/output data recorded.  Last 2 shifts: @IOLAST2SHIFTS @   PHYSICAL EXAM:  Constitutional:  -- Normal body habitus  -- Awake, alert, and oriented x3  Eyes:  -- Pupils equally round and reactive to light  -- No scleral icterus  Ear, nose, and throat:  -- No jugular venous distension  Pulmonary:  -- No crackles  -- Equal breath sounds bilaterally -- Breathing non-labored at rest Cardiovascular:  -- S1, S2 present  -- No pericardial rubs Gastrointestinal:  -- Abdomen soft, tender to palpation in the right lower quadrant, non-distended, no guarding or rebound tenderness -- No abdominal masses appreciated, pulsatile or otherwise  Musculoskeletal and Integumentary:  -- Wounds or skin discoloration: None appreciated -- Extremities: B/L UE and LE FROM, hands and feet warm, no edema  Neurologic:  -- Motor function: intact and symmetric -- Sensation: intact and symmetric   Labs:     Latest Ref Rng & Units 03/17/2023    5:48 PM 11/14/2021    5:37 PM 12/10/2016    5:53 PM  CBC  WBC 4.0 - 10.5 K/uL 10.7  6.2  10.3   Hemoglobin 12.0 - 15.0 g/dL 16.1  09.6  04.5   Hematocrit 36.0 - 46.0 % 41.4  39.5  42.8   Platelets 150 - 400 K/uL 270  300  294       Latest Ref Rng & Units 03/17/2023    5:48 PM 11/14/2021    5:37 PM 12/10/2016    5:53 PM  CMP  Glucose 70 - 99 mg/dL 409  84  811   BUN 8 - 23 mg/dL 11  13  10    Creatinine 0.44 - 1.00 mg/dL 9.14  7.82  9.56   Sodium 135 - 145 mmol/L 137  138  140   Potassium 3.5 - 5.1 mmol/L 3.2  3.3  3.4   Chloride 98 -  111 mmol/L 101  101  106   CO2 22 - 32 mmol/L 27  27  27    Calcium 8.9 - 10.3 mg/dL 9.1  9.1  8.7   Total Protein 6.5 - 8.1 g/dL 7.7  7.5    Total Bilirubin 0.3 - 1.2 mg/dL 1.2  0.5    Alkaline Phos 38 - 126 U/L 59  45    AST 15 - 41 U/L 18  32    ALT  0 - 44 U/L 10  24      Imaging studies:  EXAM: CT ABDOMEN AND PELVIS WITH CONTRAST   TECHNIQUE: Multidetector CT imaging of the abdomen and pelvis was performed using the standard protocol following bolus administration of intravenous contrast.   RADIATION DOSE REDUCTION: This exam was performed according to the departmental dose-optimization program which includes automated exposure control, adjustment of the mA and/or kV according to patient size and/or use of iterative reconstruction technique.   CONTRAST:  OMNIPAQUE IOHEXOL 300 MG/ML  SOLN   COMPARISON:  None Available.   FINDINGS: Lower chest: Bilateral lower lobe subsegmental atelectasis.   Hepatobiliary: No focal liver abnormality. No gallstones, gallbladder wall thickening, or pericholecystic fluid. No biliary dilatation.   Pancreas: No focal lesion. Normal pancreatic contour. No surrounding inflammatory changes. No main pancreatic ductal dilatation.   Spleen: Normal in size without focal abnormality.   Adrenals/Urinary Tract:   No adrenal nodule bilaterally.   Bilateral kidneys enhance symmetrically.   No hydronephrosis. No hydroureter.   The urinary bladder is unremarkable.   On delayed imaging, there is no urothelial wall thickening and there are no filling defects in the opacified portions of the bilateral collecting systems or ureters.   Stomach/Bowel: Stomach is within normal limits. No evidence of bowel wall thickening or dilatation. The appendix is enlarged in caliber measuring up to 1.2 cm. Appendiceal wall thickening hyperemia noted. The appendiceal lumen is fluid-filled. No appendicolith identified. No appendiceal wall discontinuity.  Associated periappendiceal fat stranding and trace free fluid.   Vascular/Lymphatic: No abdominal aorta or iliac aneurysm. Severe atherosclerotic plaque of the aorta and its branches. No abdominal, pelvic, or inguinal lymphadenopathy.   Reproductive: Uterus and bilateral adnexa are unremarkable.   Other: Trace right lower quadrant simple free fluid. No intraperitoneal free gas. No organized fluid collection.   Musculoskeletal:   No abdominal wall hernia or abnormality.   No suspicious lytic or blastic osseous lesions. No acute displaced fracture. Multilevel degenerative changes of the spine.   IMPRESSION: Non-perforated acute appendicitis.  No appendicolith.     Electronically Signed   By: Tish Frederickson M.D.   On: 03/17/2023 20:45  Assessment/Plan:  62 y.o. female with acute appendicitis, complicated by pertinent comorbidities including hypertension, hepatitis C, COPD.  Patient with history, physical exam and images consistent with acute appendicitis. Patient oriented about diagnosis and surgical management as treatment. Patient oriented about goals of surgery and its risk including: bowel injury, infection, abscess, bleeding, leak from cecum, intestinal adhesions, bowel obstruction, fistula, injury to the ureter among others.  Patient understood and agreed to proceed with surgery. Will admit patient, already started on antibiotic therapy, will give IV hydration since patient is NPO and schedule to OR.   Gae Gallop, MD

## 2023-03-17 NOTE — ED Provider Notes (Signed)
[facility] Provider Note    Event Date/Time   First MD Initiated Contact with Patient 03/17/23 1837     (approximate)   History   Abdominal Pain   HPI  Cassandra Jennings is a 62 y.o. female who presents with complaints of right lower quadrant abdominal pain.  She reports it started this morning and has been prevalent throughout the day.  She states it is worse when moving or bending.  No history of abdominal surgery      Physical Exam   Triage Vital Signs: ED Triage Vitals  Encounter Vitals Group     BP 03/17/23 1752 127/77     Systolic BP Percentile --      Diastolic BP Percentile --      Pulse Rate 03/17/23 1752 79     Resp 03/17/23 1752 18     Temp 03/17/23 1752 98.9 F (37.2 C)     Temp Source 03/17/23 1752 Oral     SpO2 03/17/23 1752 95 %     Weight --      Height --      Head Circumference --      Peak Flow --      Pain Score 03/17/23 1747 10     Pain Loc --      Pain Education --      Exclude from Growth Chart --     Most recent vital signs: Vitals:   03/17/23 1752  BP: 127/77  Pulse: 79  Resp: 18  Temp: 98.9 F (37.2 C)  SpO2: 95%     General: Awake, no distress.  CV:  Good peripheral perfusion.  Resp:  Normal effort.  Abd:  Tenderness in the right lower quadrant Other:     ED Results / Procedures / Treatments   Labs (all labs ordered are listed, but only abnormal results are displayed) Labs Reviewed  COMPREHENSIVE METABOLIC PANEL - Abnormal; Notable for the following components:      Result Value   Potassium 3.2 (*)    Glucose, Bld 122 (*)    All other components within normal limits  CBC - Abnormal; Notable for the following components:   WBC 10.7 (*)    All other components within normal limits  URINALYSIS, ROUTINE W REFLEX MICROSCOPIC - Abnormal; Notable for the following components:   Color, Urine YELLOW (*)    APPearance CLEAR (*)    Hgb urine dipstick SMALL (*)    Bacteria, UA RARE (*)    All  other components within normal limits  LIPASE, BLOOD     EKG     RADIOLOGY CT abdomen pelvis    PROCEDURES:  Critical Care performed:   Procedures   MEDICATIONS ORDERED IN ED: Medications  morphine (PF) 4 MG/ML injection 4 mg (4 mg Intravenous Given 03/17/23 1937)  ondansetron (ZOFRAN) injection 4 mg (4 mg Intravenous Given 03/17/23 1936)  iohexol (OMNIPAQUE) 300 MG/ML solution 100 mL (100 mLs Intravenous Contrast Given 03/17/23 2018)     IMPRESSION / MDM / ASSESSMENT AND PLAN / ED COURSE  I reviewed the triage vital signs and the nursing notes. Patient's presentation is most consistent with acute presentation with potential threat to life or bodily function.   Patient presents with abdominal pain as detailed above, given location concerning for appendicitis, ureterolithiasis, UTI  Urinalysis is overall reassuring, mild elevation of white blood cell count is nonspecific, patient treated with IV morphine, IV Zofran  CT scan pending   CT  scan is consistent with acute appendicitis, I have consulted Dr. Maia Plan of general surgery  FINAL CLINICAL IMPRESSION(S) / ED DIAGNOSES   Final diagnoses:  Acute appendicitis, unspecified acute appendicitis type     Rx / DC Orders   ED Discharge Orders     None        Note:  This document was prepared using Dragon voice recognition software and may include unintentional dictation errors.   Jene Every, MD 03/17/23 2100

## 2023-03-17 NOTE — ED Triage Notes (Signed)
Pt comes with c/o abdominal pain. Pt states this all started today when she woke up. Pt states pain all over. Pt denies any N/V. Pt denies any urinary symptoms.

## 2023-03-17 NOTE — ED Notes (Signed)
See triage notes. Patient c/o abdominal and all over pain. No N/V and no urinary issues

## 2023-03-18 ENCOUNTER — Other Ambulatory Visit: Payer: Self-pay

## 2023-03-18 ENCOUNTER — Observation Stay: Payer: MEDICAID | Admitting: Certified Registered"

## 2023-03-18 ENCOUNTER — Encounter: Payer: Self-pay | Admitting: General Surgery

## 2023-03-18 MED ORDER — ACETAMINOPHEN 10 MG/ML IV SOLN: 1000.0000 mg | Freq: Once | INTRAVENOUS | Status: DC | PRN

## 2023-03-18 MED ORDER — LABETALOL HCL 5 MG/ML IV SOLN
INTRAVENOUS | Status: DC | PRN
  Administered 2023-03-18: 5 mg via INTRAVENOUS

## 2023-03-18 MED ORDER — SUCCINYLCHOLINE CHLORIDE 200 MG/10ML IV SOSY
PREFILLED_SYRINGE | INTRAVENOUS | Status: DC | PRN
  Administered 2023-03-18: 60 mg via INTRAVENOUS

## 2023-03-18 MED ORDER — ONDANSETRON HCL 4 MG/2ML IJ SOLN
INTRAMUSCULAR | Status: DC | PRN
  Administered 2023-03-18: 4 mg via INTRAVENOUS

## 2023-03-18 MED ORDER — FENTANYL CITRATE (PF) 100 MCG/2ML IJ SOLN
25.0000 ug | INTRAMUSCULAR | Status: DC | PRN
  Administered 2023-03-18 (×2): 25 ug via INTRAVENOUS

## 2023-03-18 MED ORDER — ONDANSETRON HCL 4 MG/2ML IJ SOLN: 4.0000 mg | Freq: Once | INTRAMUSCULAR | Status: DC | PRN

## 2023-03-18 MED ORDER — DEXAMETHASONE SODIUM PHOSPHATE 10 MG/ML IJ SOLN
INTRAMUSCULAR | Status: DC | PRN
  Administered 2023-03-18: 10 mg via INTRAVENOUS

## 2023-03-18 MED ORDER — OXYCODONE HCL 5 MG PO TABS
5.0000 mg | ORAL_TABLET | Freq: Once | ORAL | Status: AC | PRN
  Administered 2023-03-18: 5 mg via ORAL

## 2023-03-18 MED ORDER — LIDOCAINE HCL (CARDIAC) PF 100 MG/5ML IV SOSY
PREFILLED_SYRINGE | INTRAVENOUS | Status: DC | PRN
  Administered 2023-03-18: 60 mg via INTRAVENOUS

## 2023-03-18 MED ORDER — HYDROCODONE-ACETAMINOPHEN 5-325 MG PO TABS: 1.0000 | ORAL_TABLET | ORAL | 0 refills | Status: AC | PRN

## 2023-03-18 MED ORDER — LABETALOL HCL 5 MG/ML IV SOLN
INTRAVENOUS | Status: AC
  Filled 2023-03-18: qty 4

## 2023-03-18 MED ORDER — OXYCODONE HCL 5 MG PO TABS
ORAL_TABLET | ORAL | Status: AC
  Filled 2023-03-18: qty 1

## 2023-03-18 MED ORDER — PHENYLEPHRINE 80 MCG/ML (10ML) SYRINGE FOR IV PUSH (FOR BLOOD PRESSURE SUPPORT)
PREFILLED_SYRINGE | INTRAVENOUS | Status: AC
  Filled 2023-03-18: qty 10

## 2023-03-18 MED ORDER — PROPOFOL 10 MG/ML IV BOLUS
INTRAVENOUS | Status: AC
  Filled 2023-03-18: qty 20

## 2023-03-18 MED ORDER — LACTATED RINGERS IV SOLN: INTRAVENOUS | Status: DC | PRN

## 2023-03-18 MED ORDER — FENTANYL CITRATE (PF) 100 MCG/2ML IJ SOLN
INTRAMUSCULAR | Status: AC
  Filled 2023-03-18: qty 2

## 2023-03-18 MED ORDER — PROPOFOL 10 MG/ML IV BOLUS
INTRAVENOUS | Status: DC | PRN
  Administered 2023-03-18: 100 mg via INTRAVENOUS

## 2023-03-18 MED ORDER — ACETAMINOPHEN 10 MG/ML IV SOLN
INTRAVENOUS | Status: AC
  Filled 2023-03-18: qty 100

## 2023-03-18 MED ORDER — SODIUM CHLORIDE 0.9 % IR SOLN
Status: DC | PRN
  Administered 2023-03-18: 180 mL

## 2023-03-18 MED ORDER — MIDAZOLAM HCL 2 MG/2ML IJ SOLN
INTRAMUSCULAR | Status: DC | PRN
  Administered 2023-03-18: 2 mg via INTRAVENOUS

## 2023-03-18 MED ORDER — ROCURONIUM BROMIDE 100 MG/10ML IV SOLN
INTRAVENOUS | Status: DC | PRN
  Administered 2023-03-18: 50 mg via INTRAVENOUS

## 2023-03-18 MED ORDER — SUCCINYLCHOLINE CHLORIDE 200 MG/10ML IV SOSY
PREFILLED_SYRINGE | INTRAVENOUS | Status: AC
  Filled 2023-03-18: qty 10

## 2023-03-18 MED ORDER — ONDANSETRON HCL 4 MG/2ML IJ SOLN
INTRAMUSCULAR | Status: AC
  Filled 2023-03-18: qty 2

## 2023-03-18 MED ORDER — BUPIVACAINE-EPINEPHRINE (PF) 0.5% -1:200000 IJ SOLN
INTRAMUSCULAR | Status: DC | PRN
  Administered 2023-03-18: 30 mL via PERINEURAL

## 2023-03-18 MED ORDER — DEXMEDETOMIDINE HCL IN NACL 80 MCG/20ML IV SOLN
INTRAVENOUS | Status: DC | PRN
  Administered 2023-03-18: 20 ug via INTRAVENOUS

## 2023-03-18 MED ORDER — LIDOCAINE HCL (PF) 2 % IJ SOLN
INTRAMUSCULAR | Status: AC
  Filled 2023-03-18: qty 5

## 2023-03-18 MED ORDER — ROCURONIUM BROMIDE 10 MG/ML (PF) SYRINGE
PREFILLED_SYRINGE | INTRAVENOUS | Status: AC
  Filled 2023-03-18: qty 10

## 2023-03-18 MED ORDER — FENTANYL CITRATE (PF) 100 MCG/2ML IJ SOLN
INTRAMUSCULAR | Status: DC | PRN
  Administered 2023-03-18 (×2): 50 ug via INTRAVENOUS

## 2023-03-18 MED ORDER — ACETAMINOPHEN 10 MG/ML IV SOLN
INTRAVENOUS | Status: DC | PRN
  Administered 2023-03-18: 1000 mg via INTRAVENOUS

## 2023-03-18 MED ORDER — PHENYLEPHRINE 80 MCG/ML (10ML) SYRINGE FOR IV PUSH (FOR BLOOD PRESSURE SUPPORT)
PREFILLED_SYRINGE | INTRAVENOUS | Status: DC | PRN
  Administered 2023-03-18: 80 ug via INTRAVENOUS

## 2023-03-18 MED ORDER — DEXAMETHASONE SODIUM PHOSPHATE 10 MG/ML IJ SOLN
INTRAMUSCULAR | Status: AC
  Filled 2023-03-18: qty 1

## 2023-03-18 MED ORDER — SUGAMMADEX SODIUM 200 MG/2ML IV SOLN
INTRAVENOUS | Status: DC | PRN
  Administered 2023-03-18: 200 mg via INTRAVENOUS

## 2023-03-18 MED ORDER — MIDAZOLAM HCL 2 MG/2ML IJ SOLN
INTRAMUSCULAR | Status: AC
  Filled 2023-03-18: qty 2

## 2023-03-18 MED ORDER — OXYCODONE HCL 5 MG/5ML PO SOLN: 5.0000 mg | Freq: Once | ORAL | Status: AC | PRN

## 2023-03-18 NOTE — Op Note (Signed)
Pre-op Diagnosis: Acute appendicitis   Post op Diagnosis: Acute appenditicis  Procedure: Robotic assisted laparoscopic appendectomy.  Anesthesia: GETA  Surgeon: Carolan Shiver, MD, FACS  Wound Classification: clean contaminated  Specimen: Appendix  Complications: None  Estimated Blood Loss: 25 mL   Indications: Patient is a 62 y.o. female  presented with above right lower quadrant pain. CT scan shows acute appendicitis.     FIndings: 1.  Suppurative appendix 2. No peri-appendiceal abscess or phlegmon 3. Hepatomegaly 4. Adequate hemostasis.   Description of procedure: The patient was placed on the operating table in the supine position. General anesthesia was induced. A time-out was completed verifying correct patient, procedure, site, positioning, and implant(s) and/or special equipment prior to beginning this procedure. The abdomen was prepped and draped in the usual sterile fashion.   Palmer's point located and Veress needle was inserted.  After confirming 2 clicks and a positive saline drop test, gas insufflation was initiated until the abdominal pressure was measured at 15 mmHg.  Afterwards, the Veress needle was removed and a 8 mm port was placed in left upper quadrant area using Optiview technique.  After local was infused, 3 additional incision on the left abdominal wall were made 5 cm apart.  An 12 mm port and two other 8 mm ports were placed under direct visualization.  No injuries from trocar placements were noted.  The table was placed in the Trendelenburg position with the right side elevated.  With the use of Tip up grasper, fenestrated bipolar and monopolar scissors, an inflamed appendix was identified and elevated.  Window created at base of appendix in the mesentery.    The mesoappendix was divided with combination of bipolar energy and monopolar scissors.  The base of the appendix was ligated with 3-0 V-Loc.  The appendix was divided with monopolar scissors.  A  second layer of the 3 oh V-Loc was done over the appendiceal stump.  The appendiceal stump was examined and hemostasis noted. No other pathology was identified within pelvis. The 12 mm trocar removed and port site closed with PMI using 0 vicryl under direct vision. Remaining trocars were removed under direct vision. No bleeding was noted.The abdomen was allowed to collapse.  All skin incisions then closed with subcuticular sutures Monocryl 4-0.  Wounds then dressed with dermabond.  The patient tolerated the procedure well, awakened from anesthesia and was taken to the postanesthesia care unit in satisfactory condition.  Sponge count and instrument count correct at the end of the procedure.

## 2023-03-18 NOTE — Discharge Summary (Signed)
  Patient ID: Cassandra Jennings MRN: [id number] DOB/AGE: 63-Sep-1962 63 y.o.  Admit date: 03/17/2023 Discharge date: 03/18/2023   Discharge Diagnoses:  Principal Problem:   Acute appendicitis with localized peritonitis   Procedures: Robotic assisted laparoscopic appendectomy  Hospital Course: Patient admitted with acute appendicitis. She underwent robotic appendectomy.  She has been recovering well.  Pain is controlled.  Patient ambulating.  Patient voiding spontaneously.  Patient tolerated diet.  The wounds are dry and clean.  Physical Exam Vitals reviewed.  HENT:     Head: Normocephalic.  Cardiovascular:     Rate and Rhythm: Normal rate and regular rhythm.  Pulmonary:     Effort: Pulmonary effort is normal.     Breath sounds: Normal breath sounds.  Abdominal:     General: Abdomen is flat. Bowel sounds are normal.     Palpations: Abdomen is soft.  Neurological:     Mental Status: She is alert and oriented to person, place, and time.      Consults: None  Disposition: Discharge disposition: 01-Home or Self Care       Discharge Instructions     Diet - low sodium heart healthy   Complete by: As directed    Increase activity slowly   Complete by: As directed       Allergies as of 03/18/2023       Reactions   Penicillins Anaphylaxis   Codeine         Medication List     TAKE these medications    albuterol 108 (90 Base) MCG/ACT inhaler Commonly known as: VENTOLIN HFA Inhale 2 puffs into the lungs every 6 (six) hours as needed for wheezing or shortness of breath. What changed: when to take this   amphetamine-dextroamphetamine 10 MG 24 hr capsule Commonly known as: ADDERALL XR Take 10 mg by mouth daily.   cetirizine 10 MG tablet Commonly known as: ZYRTEC Take 10 mg by mouth daily.   famotidine 40 MG tablet Commonly known as: PEPCID Take 40 mg by mouth daily.   HYDROcodone-acetaminophen 5-325 MG tablet Commonly known as: Norco Take 1 tablet by  mouth every 4 (four) hours as needed for up to 3 days for moderate pain.   lidocaine 5 % Commonly known as: LIDODERM Place 1 patch onto the skin daily. Remove & Discard patch within 12 hours or as directed by MD   lisinopril-hydrochlorothiazide 20-12.5 MG tablet Commonly known as: ZESTORETIC Take 1 tablet by mouth daily.   meloxicam 7.5 MG tablet Commonly known as: MOBIC Take 7.5 mg by mouth daily.   umeclidinium-vilanterol 62.5-25 MCG/ACT Aepb Commonly known as: ANORO ELLIPTA Inhale 1 puff into the lungs daily.        Follow-up Information     Carolan Shiver, MD Follow up on 04/08/2023.   Specialty: General Surgery Why: Follow up after appendectomy Contact information: [address] [place] [address] [phone]

## 2023-03-18 NOTE — Transfer of Care (Signed)
Immediate Anesthesia Transfer of Care Note  Patient: Kathyrn E Joos  Procedure(s) Performed: XI ROBOTIC LAPAROSCOPIC ASSISTED APPENDECTOMY  Patient Location: PACU  Anesthesia Type:General  Level of Consciousness: awake, alert , and oriented  Airway & Oxygen Therapy: Patient Spontanous Breathing and Patient connected to face mask oxygen  Post-op Assessment: Report given to RN, Post -op Vital signs reviewed and stable, and Patient moving all extremities  Post vital signs: Reviewed and stable  Last Vitals:  Vitals Value Taken Time  BP 152/84 03/18/23 0208  Temp    Pulse 55 03/18/23 0213  Resp    SpO2 100 % 03/18/23 0213  Vitals shown include unfiled device data.  Last Pain:  Vitals:   03/17/23 1752  TempSrc: Oral  PainSc:          Complications: No notable events documented.

## 2023-03-18 NOTE — Progress Notes (Signed)
1000 Patient had emergency surgery earlier this morning for a ruptured appendix AND now has antibiotics infusing as ordered per the MD.  Will not be able to discharge until antibiotics are finished.

## 2023-03-18 NOTE — Anesthesia Preprocedure Evaluation (Signed)
Anesthesia Evaluation  Patient identified by MRN, date of birth, ID band Patient awake  General Assessment Comment:  Patient does not seem to be the most compliant with medication and medical care - says things like "they want me to be on oxygen and inhalers all the time, but I don't want it."  Reviewed: Allergy & Precautions, NPO status , Patient's Chart, lab work & pertinent test results  History of Anesthesia Complications Negative for: history of anesthetic complications  Airway Mallampati: II  TM Distance: >3 FB Neck ROM: Full    Dental  (+) Edentulous Upper, Edentulous Lower   Pulmonary neg sleep apnea, COPD,  COPD inhaler, Current Smoker and Patient abstained from smoking.   Pulmonary exam normal breath sounds clear to auscultation       Cardiovascular Exercise Tolerance: Good METShypertension, (-) CAD and (-) Past MI (-) dysrhythmias  Rhythm:Regular Rate:Normal - Systolic murmurs    Neuro/Psych  PSYCHIATRIC DISORDERS Anxiety     negative neurological ROS     GI/Hepatic ,neg GERD  ,,(+)     substance abuse  alcohol use, Hepatitis -, CHep C Treated/cured per patient. 3-4 glasses of wine nightly   Endo/Other  neg diabetes    Renal/GU negative Renal ROS     Musculoskeletal   Abdominal  (+)  Abdomen: tender.   Peds  Hematology   Anesthesia Other Findings Past Medical History: No date: Anxiety No date: COPD (chronic obstructive pulmonary disease) (HCC) No date: Hepatitis No date: Hypertension  Reproductive/Obstetrics                             Anesthesia Physical Anesthesia Plan  ASA: 3  Anesthesia Plan: General   Post-op Pain Management: Ofirmev IV (intra-op)*   Induction: Intravenous and Rapid sequence  PONV Risk Score and Plan: 3 and Ondansetron, Dexamethasone and Midazolam  Airway Management Planned: Oral ETT  Additional Equipment: None  Intra-op Plan:    Post-operative Plan: Extubation in OR  Informed Consent: I have reviewed the patients History and Physical, chart, labs and discussed the procedure including the risks, benefits and alternatives for the proposed anesthesia with the patient or authorized representative who has indicated his/her understanding and acceptance.     Dental advisory given  Plan Discussed with: CRNA and Surgeon  Anesthesia Plan Comments: (Last meal 12 hours ago. Discussed risks of anesthesia with patient, including PONV, sore throat, lip/dental/eye damage. Rare risks discussed as well, such as cardiorespiratory and neurological sequelae, and allergic reactions. Discussed the role of CRNA in patient's perioperative care. Patient understands. Patient counseled on benefits of smoking cessation, and increased perioperative risks associated with continued smoking.  51kg.)       Anesthesia Quick Evaluation

## 2023-03-18 NOTE — Plan of Care (Signed)
  Problem: Health Behavior/Discharge Planning: Goal: Ability to manage health-related needs will improve Outcome: Progressing   

## 2023-03-18 NOTE — Discharge Instructions (Signed)

## 2023-03-18 NOTE — Progress Notes (Signed)
I have reviewed and concur with this student's documentation.   Leodis Sias, RN 03/18/2023 11:04 AM

## 2023-03-18 NOTE — Anesthesia Postprocedure Evaluation (Signed)
Anesthesia Post Note  Patient: Cassandra Jennings  Procedure(s) Performed: XI ROBOTIC LAPAROSCOPIC ASSISTED APPENDECTOMY  Patient location during evaluation: PACU Anesthesia Type: General Level of consciousness: awake and alert Pain management: pain level controlled Vital Signs Assessment: post-procedure vital signs reviewed and stable Respiratory status: spontaneous breathing, nonlabored ventilation, respiratory function stable and patient connected to nasal cannula oxygen Cardiovascular status: blood pressure returned to baseline and stable Postop Assessment: no apparent nausea or vomiting Anesthetic complications: no   No notable events documented.   Last Vitals:  Vitals:   03/18/23 0459 03/18/23 0612  BP: 132/64 (!) 103/51  Pulse: 62 (!) 51  Resp: 18 16  Temp: 36.9 C 37.1 C  SpO2: 97% 99%    Last Pain:  Vitals:   03/18/23 0612  TempSrc: Oral  PainSc:                  Corinda Gubler

## 2023-03-18 NOTE — Plan of Care (Signed)
Resolve Care plan.

## 2023-03-18 NOTE — Anesthesia Procedure Notes (Signed)
Procedure Name: Intubation Date/Time: 03/18/2023 12:40 AM  Performed by: Katherine Basset, CRNAPre-anesthesia Checklist: Patient identified, Emergency Drugs available, Suction available and Patient being monitored Patient Re-evaluated:Patient Re-evaluated prior to induction Oxygen Delivery Method: Circle system utilized Preoxygenation: Pre-oxygenation with 100% oxygen Induction Type: IV induction and Rapid sequence Laryngoscope Size: Miller and 3 Grade View: Grade I Tube type: Oral Tube size: 6.5 mm Number of attempts: 1 Airway Equipment and Method: Stylet, Oral airway and Bite block Placement Confirmation: ETT inserted through vocal cords under direct vision, positive ETCO2 and breath sounds checked- equal and bilateral Secured at: 20 cm Tube secured with: Tape Dental Injury: Teeth and Oropharynx as per pre-operative assessment

## 2023-03-18 NOTE — TOC CM/SW Note (Signed)
Transition of Care [facility]) - Inpatient Brief Assessment   Patient Details  Name: Cassandra Jennings MRN: [id number] Date of Birth: 12/06/1960  Transition of Care [facility]) CM/SW Contact:    Margarito Liner, LCSW Phone Number: 03/18/2023, 9:21 AM   Clinical Narrative: Patient has orders to discharge home today. Chart reviewed. No TOC needs identified. CSW signing off.  Transition of Care Asessment: Insurance and Status: Insurance coverage has been reviewed Patient has primary care physician: Yes Home environment has been reviewed: Single family home Prior level of function:: Not documented Prior/Current Home Services: No current home services Social Determinants of Health Reivew: SDOH reviewed no interventions necessary Readmission risk has been reviewed: Yes Transition of care needs: no transition of care needs at this time

## 2023-03-30 ENCOUNTER — Emergency Department: Payer: MEDICAID

## 2023-03-30 ENCOUNTER — Other Ambulatory Visit: Payer: Self-pay

## 2023-03-30 ENCOUNTER — Emergency Department
Admission: EM | Admit: 2023-03-30 | Discharge: 2023-03-30 | Disposition: A | Discharge status: Home / Self Care | Payer: MEDICAID | Attending: Emergency Medicine | Admitting: Emergency Medicine

## 2023-03-30 DIAGNOSIS — R0602 Shortness of breath: Secondary | ICD-10-CM | POA: Diagnosis present

## 2023-03-30 DIAGNOSIS — J441 Chronic obstructive pulmonary disease with (acute) exacerbation: Secondary | ICD-10-CM | POA: Diagnosis not present

## 2023-03-30 DIAGNOSIS — I10 Essential (primary) hypertension: Secondary | ICD-10-CM | POA: Diagnosis not present

## 2023-03-30 LAB — CBC
HCT: 38.5 % (ref 36.0–46.0)
Hemoglobin: 13.1 g/dL (ref 12.0–15.0)
MCH: 29.7 pg (ref 26.0–34.0)
MCHC: 34 g/dL (ref 30.0–36.0)
MCV: 87.3 fL (ref 80.0–100.0)
Platelets: 328 10*3/uL (ref 150–400)
RBC: 4.41 MIL/uL (ref 3.87–5.11)
RDW: 11.5 % (ref 11.5–15.5)
WBC: 8.5 10*3/uL (ref 4.0–10.5)
nRBC: 0 % (ref 0.0–0.2)

## 2023-03-30 LAB — BASIC METABOLIC PANEL
Anion gap: 10 (ref 5–15)
BUN: 10 mg/dL (ref 8–23)
CO2: 26 mmol/L (ref 22–32)
Calcium: 9.2 mg/dL (ref 8.9–10.3)
Chloride: 97 mmol/L — ABNORMAL LOW (ref 98–111)
Creatinine, Ser: 0.72 mg/dL (ref 0.44–1.00)
GFR, Estimated: >60 mL/min (ref >60)
Glucose, Bld: 102 mg/dL — ABNORMAL HIGH (ref 70–99)
Potassium: 3.9 mmol/L (ref 3.5–5.1)
Sodium: 133 mmol/L — ABNORMAL LOW (ref 135–145)

## 2023-03-30 LAB — TROPONIN I (HIGH SENSITIVITY)
Troponin I (High Sensitivity): 4 ng/L (ref <18)
Troponin I (High Sensitivity): 5 ng/L (ref <18)

## 2023-03-30 MED ORDER — IOHEXOL 350 MG/ML SOLN
75.0000 mL | Freq: Once | INTRAVENOUS | Status: AC | PRN
  Administered 2023-03-30: 75 mL via INTRAVENOUS

## 2023-03-30 MED ORDER — IPRATROPIUM-ALBUTEROL 0.5-2.5 (3) MG/3ML IN SOLN
3.0000 mL | Freq: Once | RESPIRATORY_TRACT | Status: AC
  Administered 2023-03-30: 3 mL via RESPIRATORY_TRACT
  Filled 2023-03-30: qty 3

## 2023-03-30 MED ORDER — SODIUM CHLORIDE 0.9 % IV BOLUS
500.0000 mL | Freq: Once | INTRAVENOUS | Status: AC
  Administered 2023-03-30: 500 mL via INTRAVENOUS

## 2023-03-30 MED ORDER — PREDNISONE 20 MG PO TABS
40.0000 mg | ORAL_TABLET | Freq: Once | ORAL | Status: AC
  Administered 2023-03-30: 40 mg via ORAL
  Filled 2023-03-30: qty 2

## 2023-03-30 MED ORDER — AZITHROMYCIN 250 MG PO TABS: ORAL_TABLET | ORAL | 0 refills | Status: AC

## 2023-03-30 MED ORDER — PREDNISONE 20 MG PO TABS: 40.0000 mg | ORAL_TABLET | Freq: Every day | ORAL | 0 refills | Status: AC

## 2023-03-30 NOTE — ED Provider Notes (Signed)
[facility] Provider Note    Event Date/Time   First MD Initiated Contact with Patient 03/30/23 1554     (approximate)   History   Shortness of Breath   HPI  Cassandra Jennings is a 62 y.o. female history of recent robotic assisted appendectomy performed 716, reviewed discharge summary   She has a history of COPD hepatitis and hypertension  Patient reports that the last 3 days she has been using her inhaler intermittently every 4 or so hours.  She has noticed a slight shortness of breath just slight amount of wheezing and a dry cough.  No chest pain.  No fever.  She reports the pain that she awoke to in her mid upper back this morning it is achy in nature does not seem to worsen with breathing.  She denies any new leg swelling.   Physical Exam   Triage Vital Signs: ED Triage Vitals  Encounter Vitals Group     BP 03/30/23 1514 (!) 121/54     Systolic BP Percentile --      Diastolic BP Percentile --      Pulse Rate 03/30/23 1514 62     Resp 03/30/23 1514 20     Temp 03/30/23 1514 98.2 F (36.8 C)     Temp src --      SpO2 03/30/23 1514 100 %     Weight 03/30/23 1515 111 lb (50.3 kg)     Height 03/30/23 1515 5\' 2"  (1.575 m)     Head Circumference --      Peak Flow --      Pain Score 03/30/23 1514 8     Pain Loc --      Pain Education --      Exclude from Growth Chart --     Most recent vital signs: Vitals:   03/30/23 1918 03/30/23 1919  BP:  (!) 114/53  Pulse:  62  Resp:  20  Temp: 97.9 F (36.6 C)   SpO2:  100%     General: Awake, no distress.  CV:  Good peripheral perfusion.  Normal tones and rate Resp:  Normal effort.  She has some very scant expiratory wheeze throughout.  No rales.  No accessory muscle use and her work of breathing appears normal.  Oxygen saturation 100% on room air.  She reports some very mild dyspnea  On her right upper back around the third or fourth thoracic vertebrae over the right paraspinous region she has  a lidocaine patch that she placed on the today.  She reports this is the area where it feels sore.  She reports some localized discomfort in the area but is hard to reproduce.  There is no overlying lesion Abd:  No distention.  Soft nontender nondistended.  Reports she is eating drinking bowels seem to be doing well after appendicitis surgery denies any concerns related to the abdomen Other:  No tenderness to palpation along the vertebral processes of thoracic and lumbar area.  Patient denies low back pain.  Reports the back pain is in her right upper back    ED Results / Procedures / Treatments   Labs (all labs ordered are listed, but only abnormal results are displayed) Labs Reviewed  BASIC METABOLIC PANEL - Abnormal; Notable for the following components:      Result Value   Sodium 133 (*)    Chloride 97 (*)    Glucose, Bld 102 (*)    All other components within  normal limits  CBC  TROPONIN I (HIGH SENSITIVITY)  TROPONIN I (HIGH SENSITIVITY)   Labs interpreted as normal troponin, normal CBC.  Basic metabolic panel without acute abnormality except for very mild hyponatremia  EKG  Interpreted by me at 1525 heart rate 60 QRS 80 QTc 440 Normal sinus rhythm no evidence of ischemia.  Normal ECG   RADIOLOGY Chest x-ray interpreted by me is clear negative for acute   CT Angio Chest PE W and/or Wo Contrast  Result Date: 03/30/2023 CLINICAL DATA:  Shortness of breath, recent appendectomy EXAM: CT ANGIOGRAPHY CHEST WITH CONTRAST TECHNIQUE: Multidetector CT imaging of the chest was performed using the standard protocol during bolus administration of intravenous contrast. Multiplanar CT image reconstructions and MIPs were obtained to evaluate the vascular anatomy. RADIATION DOSE REDUCTION: This exam was performed according to the departmental dose-optimization program which includes automated exposure control, adjustment of the mA and/or kV according to patient size and/or use of iterative  reconstruction technique. CONTRAST:  75mL OMNIPAQUE IOHEXOL 350 MG/ML SOLN COMPARISON:  Previous studies including chest radiographs done today FINDINGS: Cardiovascular: There is homogeneous enhancement in thoracic aorta. There is ectasia of ascending thoracic aorta measuring 3.5 cm. There are scattered calcifications in thoracic aorta. There are no intraluminal filling defects in pulmonary artery branches. Heart is enlarged in size. Mediastinum/Nodes: No significant lymphadenopathy is seen. Lungs/Pleura: There is no focal pulmonary consolidation. There is slight prominence of interstitial markings in both lungs, possibly interstitial lung disease. There is no pleural effusion or pneumothorax. Upper Abdomen: No acute findings are seen. Musculoskeletal: No acute findings are seen. Review of the MIP images confirms the above findings. IMPRESSION: There is no evidence of pulmonary artery embolism. There is no evidence of thoracic aortic dissection. Aortic arteriosclerosis. There is no focal pulmonary consolidation. There is slight prominence of interstitial markings in both lungs suggesting possible chronic interstitial lung disease. Electronically Signed   By: Ernie Avena M.D.   On: 03/30/2023 17:47   DG Chest 2 View  Result Date: 03/30/2023 CLINICAL DATA:  Short of breath, vomiting, recent appendectomy EXAM: CHEST - 2 VIEW COMPARISON:  12/25/2021 FINDINGS: Frontal and lateral views of the chest demonstrate an unremarkable cardiac silhouette. No airspace disease, effusion, or pneumothorax. No acute bony abnormality. Visualized bowel gas pattern is unremarkable. IMPRESSION: 1. No acute intrathoracic process. Electronically Signed   By: Sharlet Salina M.D.   On: 03/30/2023 15:40      PROCEDURES:  Critical Care performed: No  Procedures   MEDICATIONS ORDERED IN ED: Medications  ipratropium-albuterol (DUONEB) 0.5-2.5 (3) MG/3ML nebulizer solution 3 mL (3 mLs Nebulization Given 03/30/23 1711)   ipratropium-albuterol (DUONEB) 0.5-2.5 (3) MG/3ML nebulizer solution 3 mL (3 mLs Nebulization Given 03/30/23 1709)  sodium chloride 0.9 % bolus 500 mL (0 mLs Intravenous Stopped 03/30/23 1732)  predniSONE (DELTASONE) tablet 40 mg (40 mg Oral Given 03/30/23 1708)  iohexol (OMNIPAQUE) 350 MG/ML injection 75 mL (75 mLs Intravenous Contrast Given 03/30/23 1732)     IMPRESSION / MDM / ASSESSMENT AND PLAN / ED COURSE  I reviewed the triage vital signs and the nursing notes.                              Differential diagnosis includes, but is not limited to, COPD exacerbation has a history of COPD exacerbation reports she is taken prednisone in the past and use of inhaler in the past as improved similar symptoms.  Her clinical exam  with mild wheezing slight dry cough seems consistent with COPD exacerbation but given her associated upper back pain recent surgery is a risk factor, wish to exclude pulmonary embolism.  CT imaging including PE study is pending  Treatment with DuoNebs, prednisone.  No fever no elevated white count.  No strong indicators of acute pneumonia or infection noted at this time.  Patient's presentation is most consistent with acute presentation with potential threat to life or bodily function.   The patient is on the cardiac monitor to evaluate for evidence of arrhythmia and/or significant heart rate changes.   ----------------------------------------- 8:52 PM on 03/30/2023 ----------------------------------------- Apologize for slightly late note entry due to arrival of a different critical patient.  Evaluated the patient probably 45 minutes ago, symptoms improved resting comfortably ambulating with normal oxygen saturation and work of breathing.  Lung sounds now clear.  Most consistent with mild COPD exacerbation, PE has been ruled out no evidence of ACS or other acute thoracic process to noted.  Discussed with patient we will continue prednisone for additional 4 days, Z-Pak,  and she reports has ample albuterol at the home and does not require prescription for this. Return precautions and treatment recommendations and follow-up discussed with the patient who is agreeable with the plan.      FINAL CLINICAL IMPRESSION(S) / ED DIAGNOSES   Final diagnoses:  COPD exacerbation (HCC)     Rx / DC Orders   ED Discharge Orders          Ordered    predniSONE (DELTASONE) 20 MG tablet  Daily with breakfast        03/30/23 2014    azithromycin (ZITHROMAX) 250 MG tablet        03/30/23 2014             Note:  This document was prepared using Dragon voice recognition software and may include unintentional dictation errors.   Sharyn Creamer, MD 03/30/23 2053

## 2023-03-30 NOTE — ED Notes (Signed)
Walked patient with pulse ox. 98% was lowest o2 saturation reading. Patient states she still feels some pain when she breathes. RR are even and unlabored and observed steady gait while ambulating.

## 2023-03-30 NOTE — ED Triage Notes (Signed)
Pt states last week she had her appendix removed and today she has shortness of breath and has been vomiting. Pt denies being on blood thinners and denies chest pain, but states back pain.

## 2023-03-30 NOTE — ED Notes (Signed)
Patient is resting with eyes closed. RR are even and unlabored. Vitals WNL.

## 2023-04-25 ENCOUNTER — Emergency Department: Payer: MEDICAID

## 2023-04-25 ENCOUNTER — Encounter: Payer: Self-pay | Admitting: Emergency Medicine

## 2023-04-25 ENCOUNTER — Other Ambulatory Visit: Payer: Self-pay

## 2023-04-25 ENCOUNTER — Emergency Department
Admission: EM | Admit: 2023-04-25 | Discharge: 2023-04-25 | Disposition: A | Discharge status: Home / Self Care | Payer: MEDICAID | Attending: Emergency Medicine | Admitting: Emergency Medicine

## 2023-04-25 DIAGNOSIS — N390 Urinary tract infection, site not specified: Secondary | ICD-10-CM | POA: Diagnosis not present

## 2023-04-25 DIAGNOSIS — E860 Dehydration: Secondary | ICD-10-CM | POA: Diagnosis not present

## 2023-04-25 DIAGNOSIS — R112 Nausea with vomiting, unspecified: Secondary | ICD-10-CM

## 2023-04-25 DIAGNOSIS — Z20822 Contact with and (suspected) exposure to covid-19: Secondary | ICD-10-CM | POA: Diagnosis not present

## 2023-04-25 DIAGNOSIS — I10 Essential (primary) hypertension: Secondary | ICD-10-CM | POA: Diagnosis not present

## 2023-04-25 DIAGNOSIS — J449 Chronic obstructive pulmonary disease, unspecified: Secondary | ICD-10-CM | POA: Diagnosis not present

## 2023-04-25 LAB — URINALYSIS, ROUTINE W REFLEX MICROSCOPIC
Bilirubin Urine: NEGATIVE
Glucose, UA: NEGATIVE mg/dL
Ketones, ur: 20 mg/dL — AB
Nitrite: NEGATIVE
Protein, ur: 100 mg/dL — AB
Specific Gravity, Urine: 1.024 (ref 1.005–1.030)
Squamous Epithelial / HPF: NONE SEEN /HPF (ref 0–5)
WBC, UA: >50 WBC/hpf (ref 0–5)
pH: 5 (ref 5.0–8.0)

## 2023-04-25 LAB — COMPREHENSIVE METABOLIC PANEL
ALT: 17 U/L (ref 0–44)
AST: 25 U/L (ref 15–41)
Albumin: 3.7 g/dL (ref 3.5–5.0)
Alkaline Phosphatase: 46 U/L (ref 38–126)
Anion gap: 14 (ref 5–15)
BUN: 15 mg/dL (ref 8–23)
CO2: 24 mmol/L (ref 22–32)
Calcium: 9.1 mg/dL (ref 8.9–10.3)
Chloride: 98 mmol/L (ref 98–111)
Creatinine, Ser: 0.71 mg/dL (ref 0.44–1.00)
GFR, Estimated: >60 mL/min (ref >60)
Glucose, Bld: 108 mg/dL — ABNORMAL HIGH (ref 70–99)
Potassium: 3.4 mmol/L — ABNORMAL LOW (ref 3.5–5.1)
Sodium: 136 mmol/L (ref 135–145)
Total Bilirubin: 1.2 mg/dL (ref 0.3–1.2)
Total Protein: 7 g/dL (ref 6.5–8.1)

## 2023-04-25 LAB — CBC
HCT: 39.7 % (ref 36.0–46.0)
Hemoglobin: 14.1 g/dL (ref 12.0–15.0)
MCH: 29.9 pg (ref 26.0–34.0)
MCHC: 35.5 g/dL (ref 30.0–36.0)
MCV: 84.3 fL (ref 80.0–100.0)
Platelets: 255 10*3/uL (ref 150–400)
RBC: 4.71 MIL/uL (ref 3.87–5.11)
RDW: 10.9 % — ABNORMAL LOW (ref 11.5–15.5)
WBC: 9.1 10*3/uL (ref 4.0–10.5)
nRBC: 0 % (ref 0.0–0.2)

## 2023-04-25 LAB — LIPASE, BLOOD: Lipase: 51 U/L (ref 11–51)

## 2023-04-25 LAB — SARS CORONAVIRUS 2 BY RT PCR: SARS Coronavirus 2 by RT PCR: NEGATIVE

## 2023-04-25 MED ORDER — ONDANSETRON 4 MG PO TBDP
4.0000 mg | ORAL_TABLET | Freq: Three times a day (TID) | ORAL | 0 refills | Status: AC | PRN
Start: 2023-04-25 — End: ?

## 2023-04-25 MED ORDER — CIPROFLOXACIN IN D5W 400 MG/200ML IV SOLN
400.0000 mg | Freq: Once | INTRAVENOUS | Status: AC
  Administered 2023-04-25: 400 mg via INTRAVENOUS
  Filled 2023-04-25: qty 200

## 2023-04-25 MED ORDER — ONDANSETRON HCL 4 MG/2ML IJ SOLN
4.0000 mg | Freq: Once | INTRAMUSCULAR | Status: AC
  Administered 2023-04-25: 4 mg via INTRAVENOUS
  Filled 2023-04-25: qty 2

## 2023-04-25 MED ORDER — CIPROFLOXACIN HCL 500 MG PO TABS
500.0000 mg | ORAL_TABLET | Freq: Two times a day (BID) | ORAL | 0 refills | Status: AC
Start: 2023-04-25 — End: 2023-05-02

## 2023-04-25 MED ORDER — SODIUM CHLORIDE 0.9 % IV BOLUS
1000.0000 mL | Freq: Once | INTRAVENOUS | Status: AC
  Administered 2023-04-25: 1000 mL via INTRAVENOUS

## 2023-04-25 NOTE — ED Triage Notes (Signed)
Pt to ED via POV. Pt states that she has not been able to keep anything down since Monday. Pt states that she has also been having dizziness and headaches. Pt reports that she was supposed to see her PCP on Tuesday but she was not able to go due to being sick. Pt states that she last vomited yesterday after eating ice cream. Pt is in NAD.

## 2023-04-25 NOTE — ED Provider Notes (Signed)
[facility] Provider Note    Event Date/Time   First MD Initiated Contact with Patient 04/25/23 1018     (approximate)   History   Emesis   HPI  Cassandra Jennings is a 62 y.o. female with history of COPD, hypertension, hepatitis presents emergency department with vomiting, some dizziness and headaches.  States she tried to see her doctor but she was so sick she could not get out of the bed earlier this week.  States she had an appendectomy on July 15/24.  States little sore at the incisions.  No fever or chills.  No diarrhea      Physical Exam   Triage Vital Signs: ED Triage Vitals  Encounter Vitals Group     BP 04/25/23 0820 (!) 158/86     Systolic BP Percentile --      Diastolic BP Percentile --      Pulse Rate 04/25/23 0820 74     Resp 04/25/23 0820 18     Temp 04/25/23 0820 99.4 F (37.4 C)     Temp Source 04/25/23 0820 Oral     SpO2 04/25/23 0820 97 %     Weight 04/25/23 0821 100 lb (45.4 kg)     Height 04/25/23 0821 5\' 2"  (1.575 m)     Head Circumference --      Peak Flow --      Pain Score 04/25/23 0821 10     Pain Loc --      Pain Education --      Exclude from Growth Chart --     Most recent vital signs: Vitals:   04/25/23 0820 04/25/23 1209  BP: (!) 158/86 (!) 143/74  Pulse: 74 69  Resp: 18 18  Temp: 99.4 F (37.4 C)   SpO2: 97% 100%     General: Awake, no distress.   CV:  Good peripheral perfusion. regular rate and  rhythm Resp:  Normal effort. Lungs cta Abd:  No distention.  Nontender Other:  No CVA tenderness   ED Results / Procedures / Treatments   Labs (all labs ordered are listed, but only abnormal results are displayed) Labs Reviewed  COMPREHENSIVE METABOLIC PANEL - Abnormal; Notable for the following components:      Result Value   Potassium 3.4 (*)    Glucose, Bld 108 (*)    All other components within normal limits  CBC - Abnormal; Notable for the following components:   RDW 10.9 (*)    All other  components within normal limits  URINALYSIS, ROUTINE W REFLEX MICROSCOPIC - Abnormal; Notable for the following components:   Color, Urine AMBER (*)    APPearance CLOUDY (*)    Hgb urine dipstick SMALL (*)    Ketones, ur 20 (*)    Protein, ur 100 (*)    Leukocytes,Ua MODERATE (*)    Bacteria, UA MANY (*)    All other components within normal limits  SARS CORONAVIRUS 2 BY RT PCR  URINE CULTURE  LIPASE, BLOOD     EKG  EKG   RADIOLOGY CT renal    PROCEDURES:   Procedures   MEDICATIONS ORDERED IN ED: Medications  sodium chloride 0.9 % bolus 1,000 mL (0 mLs Intravenous Stopped 04/25/23 1159)  ondansetron (ZOFRAN) injection 4 mg (4 mg Intravenous Given 04/25/23 1058)  ciprofloxacin (CIPRO) IVPB 400 mg (0 mg Intravenous Stopped 04/25/23 1159)     IMPRESSION / MDM / ASSESSMENT AND PLAN / ED COURSE  I reviewed the  triage vital signs and the nursing notes.                              Differential diagnosis includes, but is not limited to, gastroenteritis, COVID, dehydration, UTI, pyelonephritis, sepsis  Patient's presentation is most consistent with acute illness / injury with system symptoms.   Patient does not present as sepsis.  Heart rate is normal, temp is at 99 4, CBC is normal  Patient's labs are reassuring except for her urine, urine shows moderate amount of leuks, 20 ketones, many bacteria, mucus and hyaline cast are also present.  Plan at this time is to rehydrate the patient, start antibiotics, do CT renal study to assess for pyelonephritis   Due to patient's history of anaphylaxis to penicillin we will do Cipro IV instead of Rocephin.  CT renal stone study was reassuring, independently reviewed and interpreted by me  Do not feel the patient needs admission at this time.  She does appear to feel better after the medications.  Does not appear to be septic as her heart rate is normal.  Will have her follow-up with her regular doctor in 3 days if not improving.   Return emergency department worsening.  She was given a prescription for Zofran ODT and Cipro.  Discharged in stable condition.   FINAL CLINICAL IMPRESSION(S) / ED DIAGNOSES   Final diagnoses:  Lower urinary tract infectious disease  Nausea and vomiting, unspecified vomiting type  Dehydration     Rx / DC Orders   ED Discharge Orders          Ordered    ciprofloxacin (CIPRO) 500 MG tablet  2 times daily        04/25/23 1143    ondansetron (ZOFRAN-ODT) 4 MG disintegrating tablet  Every 8 hours PRN        04/25/23 1143             Note:  This document was prepared using Dragon voice recognition software and may include unintentional dictation errors.    Faythe Ghee, PA-C 04/25/23 1400    Chesley Noon, MD 04/25/23 [phone]

## 2023-04-25 NOTE — ED Notes (Signed)
Patient was given ED phone to call for transport. Patient is calm, cooperative.

## 2023-04-27 LAB — URINE CULTURE: Culture: >=100000 — AB

## 2023-07-24 ENCOUNTER — Other Ambulatory Visit: Payer: Self-pay | Admitting: Family Medicine

## 2023-07-24 DIAGNOSIS — N6489 Other specified disorders of breast: Secondary | ICD-10-CM

## 2023-09-17 ENCOUNTER — Encounter: Payer: Self-pay | Admitting: Intensive Care

## 2023-09-17 ENCOUNTER — Emergency Department
Admission: EM | Admit: 2023-09-17 | Discharge: 2023-09-17 | Disposition: A | Discharge status: Home / Self Care | Payer: MEDICAID | Attending: Emergency Medicine | Admitting: Emergency Medicine

## 2023-09-17 ENCOUNTER — Other Ambulatory Visit: Payer: Self-pay

## 2023-09-17 DIAGNOSIS — M79601 Pain in right arm: Secondary | ICD-10-CM | POA: Diagnosis present

## 2023-09-17 DIAGNOSIS — M7712 Lateral epicondylitis, left elbow: Secondary | ICD-10-CM | POA: Diagnosis not present

## 2023-09-17 MED ORDER — PREDNISONE 10 MG PO TABS: ORAL_TABLET | ORAL | 0 refills | Status: AC

## 2023-09-17 NOTE — ED Provider Notes (Addendum)
[facility] Provider Note    None    (approximate)   History   Arm Pain   HPI  Cassandra Jennings is a 63 y.o. female   is here with pain to right upper extremity pain since August.  No history of injury.  Has taken Mobic until she ran out.  Patient is right handed.    Physical Exam   Triage Vital Signs: ED Triage Vitals  Encounter Vitals Group     BP 09/17/23 1113 (!) 151/81     Systolic BP Percentile --      Diastolic BP Percentile --      Pulse Rate 09/17/23 1113 77     Resp 09/17/23 1113 16     Temp 09/17/23 1113 98.3 F (36.8 C)     Temp Source 09/17/23 1113 Oral     SpO2 09/17/23 1113 99 %     Weight 09/17/23 1114 111 lb (50.3 kg)     Height 09/17/23 1114 5\' 4"  (1.626 m)     Head Circumference --      Peak Flow --      Pain Score 09/17/23 1113 8     Pain Loc --      Pain Education --      Exclude from Growth Chart --     Most recent vital signs: Vitals:   09/17/23 1113  BP: (!) 151/81  Pulse: 77  Resp: 16  Temp: 98.3 F (36.8 C)  SpO2: 99%     General: Awake, no distress.  CV:  Good peripheral perfusion.  Resp:  Normal effort.  Abd:  No distention.  Other:  Right elbow lateral epicondylitis is markedly tender to palpation and reproduces patient's pain.  Patient is unable to make a fist without pain in this area.  No point tenderness on palpation of the right shoulder area.  No skin discoloration or edema present.  Pulses are present distally.   ED Results / Procedures / Treatments   Labs (all labs ordered are listed, but only abnormal results are displayed) Labs Reviewed - No data to display    PROCEDURES:  Critical Care performed:   Procedures   MEDICATIONS ORDERED IN ED: Medications - No data to display   IMPRESSION / MDM / ASSESSMENT AND PLAN / ED COURSE  I reviewed the triage vital signs and the nursing notes.   Differential diagnosis includes, but is not limited to, right lateral epicondylitis,  bursitis, tendinitis, osteoarthritis.  63 year old female presents to the ED with complaint of right elbow pain for approximately 5 months.  No history of injury.  Patient was markedly tender on palpation on the lateral epicondylar area.  We discussed epicondylitis and the need to obtain a tennis elbow brace at one of the local stores.  A prescription for prednisone was sent to the pharmacy for her to begin taking.  She has to follow-up with her PCP if any continued problems.      Patient's presentation is most consistent with acute complicated illness / injury requiring diagnostic workup.  FINAL CLINICAL IMPRESSION(S) / ED DIAGNOSES   Final diagnoses:  Lateral epicondylitis of left elbow     Rx / DC Orders   ED Discharge Orders          Ordered    predniSONE (DELTASONE) 10 MG tablet        09/17/23 1138             Note:  This document was  prepared using Conservation officer, historic buildings and may include unintentional dictation errors.   Tommi Rumps, PA-C 09/17/23 1214    Tommi Rumps, PA-C 09/17/23 1620    Sharman Cheek, MD 09/18/23 [phone]

## 2023-09-17 NOTE — ED Triage Notes (Signed)
 Patient c/o right shoulder and arm pain since August. Denies injury

## 2023-09-17 NOTE — Discharge Instructions (Signed)
 Follow up with your primary care provider.  Get a tennis elbow splint.   Prednisone  as directed.

## 2023-12-02 ENCOUNTER — Other Ambulatory Visit: Payer: Self-pay | Admitting: Nurse Practitioner

## 2023-12-02 DIAGNOSIS — N6489 Other specified disorders of breast: Secondary | ICD-10-CM

## 2023-12-11 ENCOUNTER — Other Ambulatory Visit: Payer: MEDICAID

## 2024-01-08 ENCOUNTER — Other Ambulatory Visit: Payer: MEDICAID

## 2024-01-15 IMAGING — CT CT ANGIO HEAD-NECK (W OR W/O PERF)
2 of 11 series · 8 of 33 positions shown · IV contrast (APPLIED)
Comparison: None.

CLINICAL DATA: Frontal headache behind eyes and in occipital region

EXAM:
CT ANGIOGRAPHY HEAD AND NECK
TECHNIQUE: Multidetector CT imaging of the head and neck was performed using
the standard protocol during bolus administration of intravenous
contrast. Multiplanar CT image reconstructions and MIPs were
obtained to evaluate the vascular anatomy. Carotid stenosis
measurements (when applicable) are obtained utilizing NASCET
criteria, using the distal internal carotid diameter as the
denominator.

[Series 8: cta neck/head · axial · 0.54mm/px · z∈[-342,+2]mm · 3 of 173 slices shown]
[im 1/173  soft-tissue]
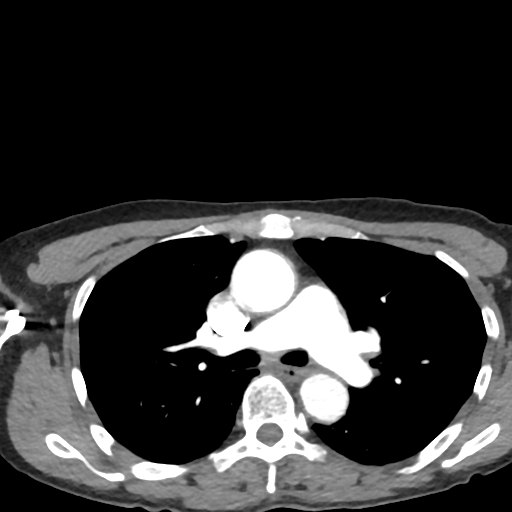
[im 87/173  bone]
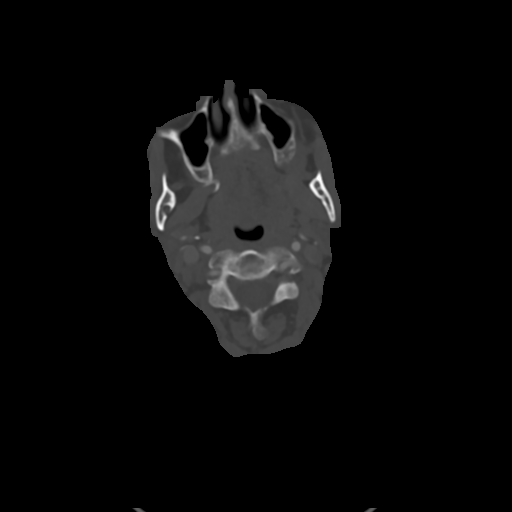
[im 173/173  soft-tissue]
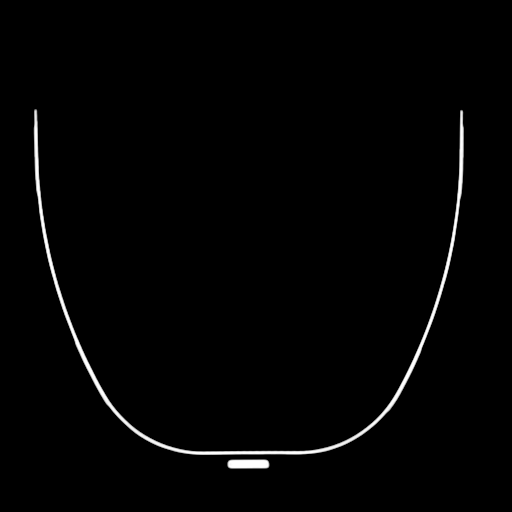

[Series 12: ax thins · axial · 0.39mm/px · z∈[-316,-91]mm · 5 of 347 slices shown]
[im 58/347  soft-tissue]
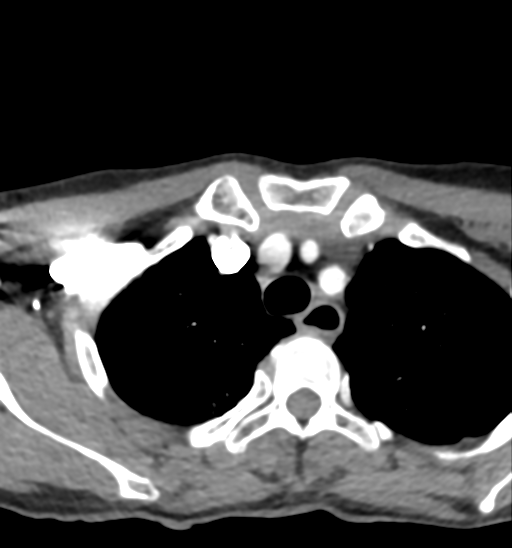
[im 116/347  soft-tissue]
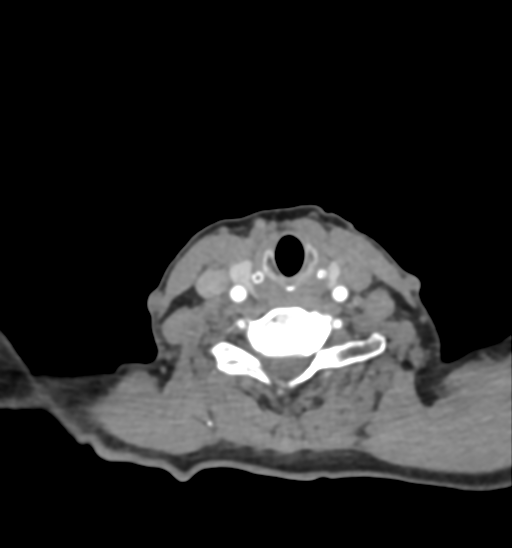
[im 174/347  soft-tissue]
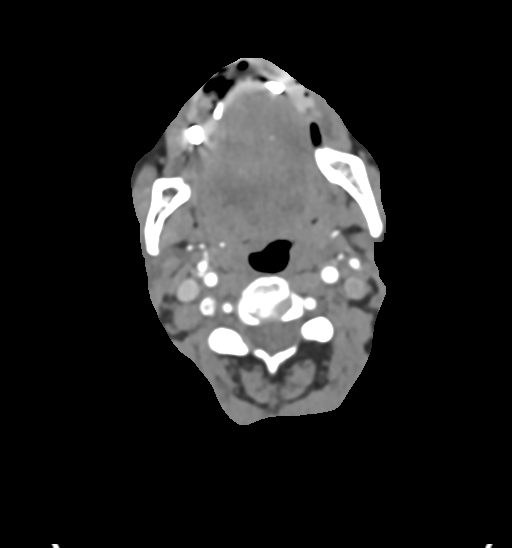
[im 231/347  soft-tissue]
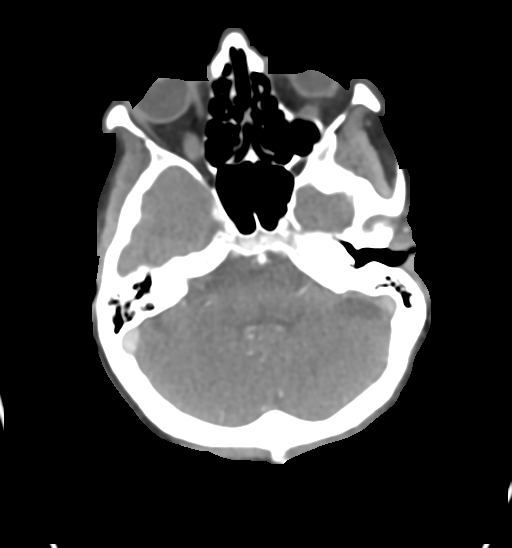
[im 289/347  soft-tissue]
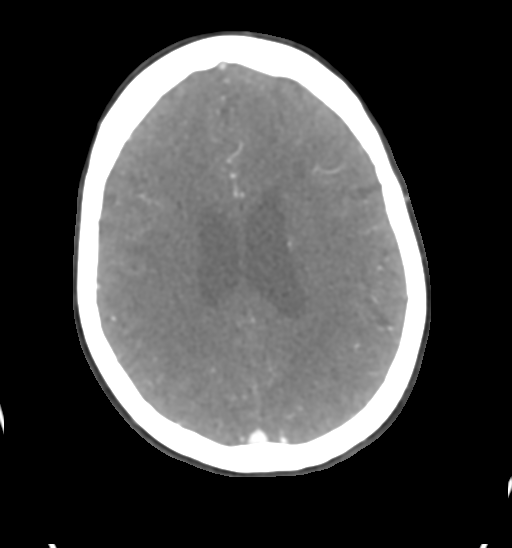

[8 of 33 positions shown; findings below may reference images not displayed]

RADIATION DOSE REDUCTION: This exam was performed according to the
departmental dose-optimization program which includes automated
exposure control, adjustment of the mA and/or kV according to
patient size and/or use of iterative reconstruction technique.

CONTRAST:  75mL OMNIPAQUE IOHEXOL 350 MG/ML SOLN
FINDINGS: CT HEAD FINDINGS

Brain: No evidence of acute infarction, hemorrhage, cerebral edema,
mass, mass effect, or midline shift. No hydrocephalus or extra-axial
fluid collection. Periventricular white matter changes, likely the
sequela of chronic small vessel ischemic disease.

Vascular: No hyperdense vessel.

Skull: Normal. Negative for fracture or focal lesion.

Sinuses/Orbits: No acute finding.

Other: Trace fluid in the left mastoid air cells.

CTA NECK FINDINGS

Aortic arch: Standard branching. Imaged portion shows no evidence of
aneurysm or dissection. No significant stenosis of the major arch
vessel origins. Aortic atherosclerosis.

Right carotid system: No evidence of dissection, stenosis (50% or
greater) or occlusion.

Left carotid system: No evidence of dissection, stenosis (50% or
greater) or occlusion.

Vertebral arteries: Codominant. No evidence of dissection, stenosis
(50% or greater) or occlusion.

Skeleton: Degenerative changes in the cervical spine with
straightening and reversal of the normal cervical lordosis. No acute
osseous abnormality.

Other neck: Negative.

Upper chest: Emphysema. No focal pulmonary opacity or pleural
effusion.

Review of the MIP images confirms the above findings

CTA HEAD FINDINGS

Anterior circulation: Both internal carotid arteries are patent to
the termini, without significant stenosis.

A1 segments patent. Normal anterior communicating artery. Anterior
cerebral arteries are patent to their distal aspects.

No M1 stenosis or occlusion. Normal MCA bifurcations. Distal MCA
branches perfused and symmetric.

Posterior circulation: Vertebral arteries patent to the
vertebrobasilar junction without stenosis. Basilar patent to its
distal aspect. Superior cerebellar arteries patent bilaterally.

Patent P1 segments. PCAs perfused to their distal aspects without
stenosis. The right posterior communicating artery is patent. The
left posterior communicating artery is not visualized.

Venous sinuses: As permitted by contrast timing, patent.

Anatomic variants: None significant.

Review of the MIP images confirms the above findings
IMPRESSION: 1.  No acute intracranial process.
2.  No intracranial large vessel occlusion or significant stenosis.
3.  No hemodynamically significant stenosis in the neck.

## 2024-01-30 ENCOUNTER — Ambulatory Visit
Admission: RE | Admit: 2024-01-30 | Discharge: 2024-01-30 | Disposition: A | Discharge status: Home / Self Care | Payer: MEDICAID | Source: Ambulatory Visit | Attending: Nurse Practitioner | Admitting: Nurse Practitioner

## 2024-01-30 DIAGNOSIS — N6489 Other specified disorders of breast: Secondary | ICD-10-CM | POA: Diagnosis present

## 2024-02-25 IMAGING — CR DG RIBS W/ CHEST 3+V*R*
1 series · 3 of 3 positions shown · non-contrast
Comparison: 01/14/2018

CLINICAL DATA: Right rib pain and cough for 1 week

EXAM:
RIGHT RIBS AND CHEST - 3+ VIEW

[Series 1: dg ribs unilateral w/chest right · 0.14mm/px · 3 of 3 slices shown]
[im 1/3]
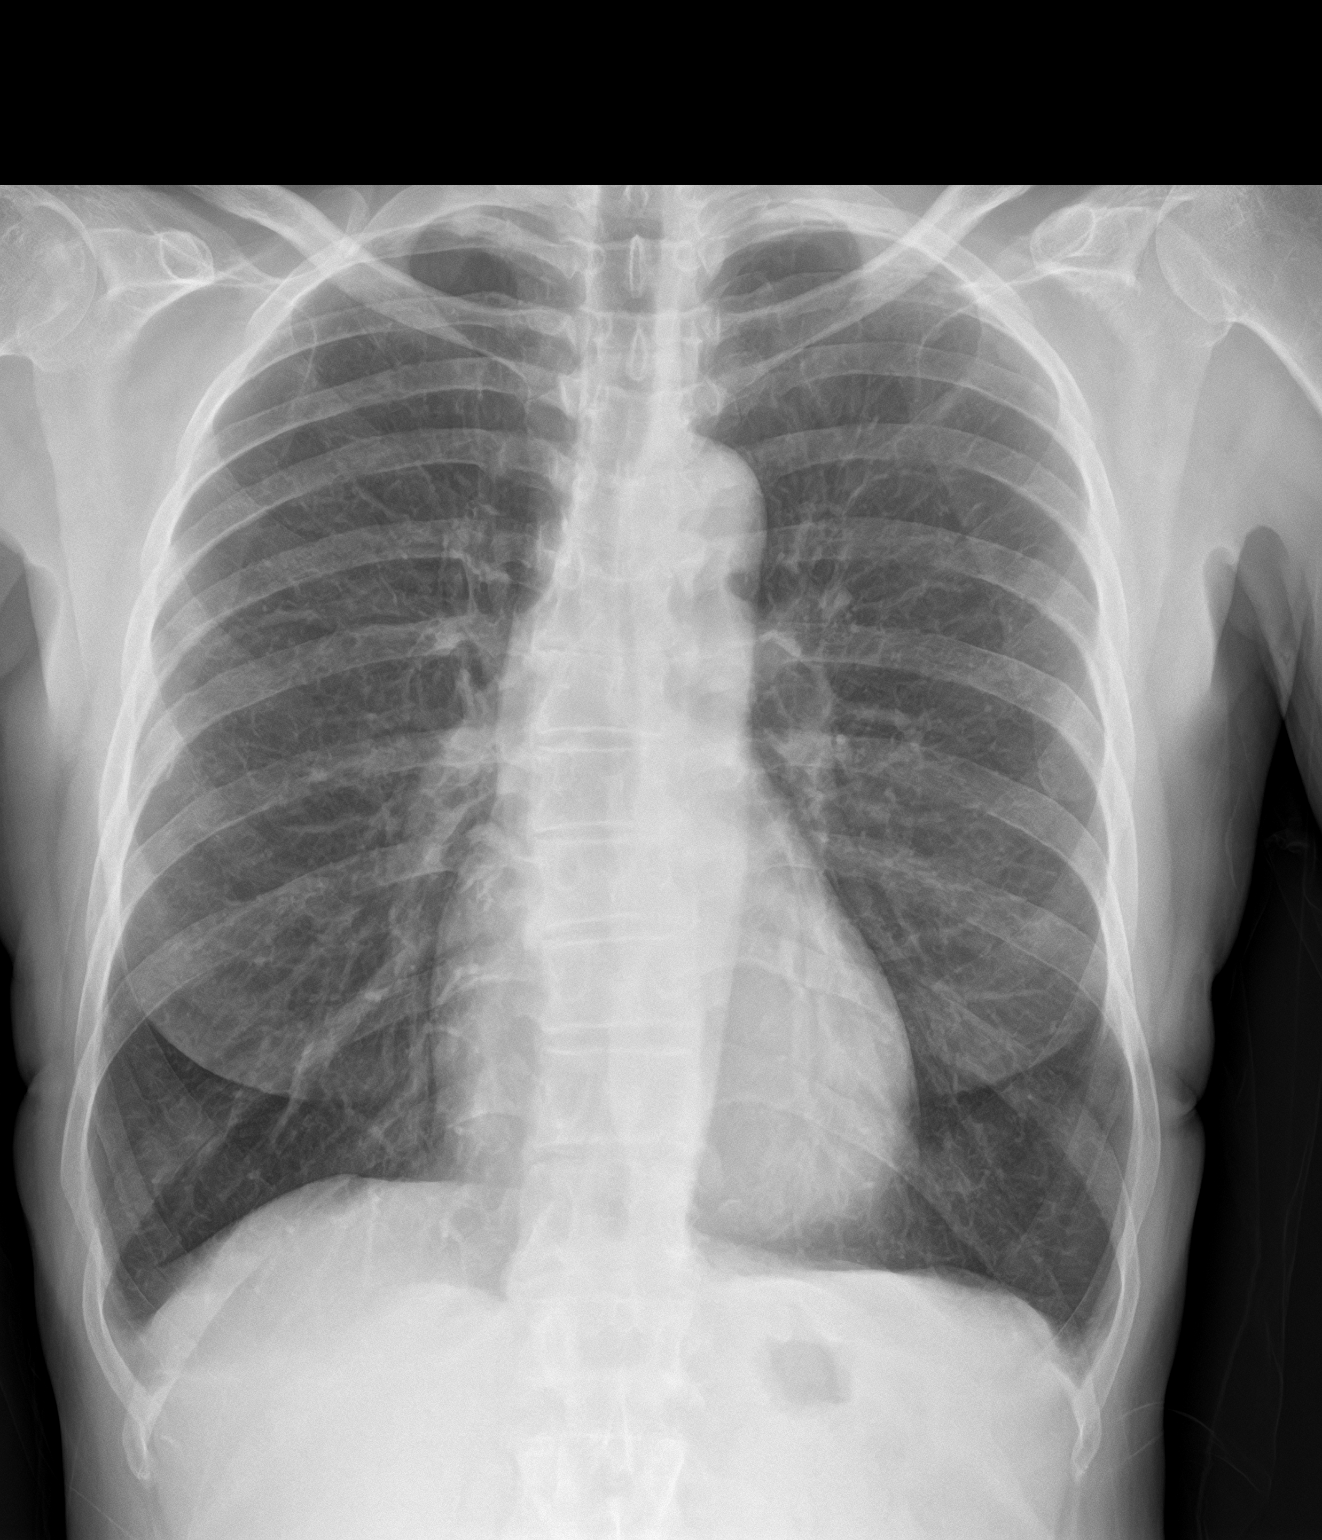
[im 2/3]
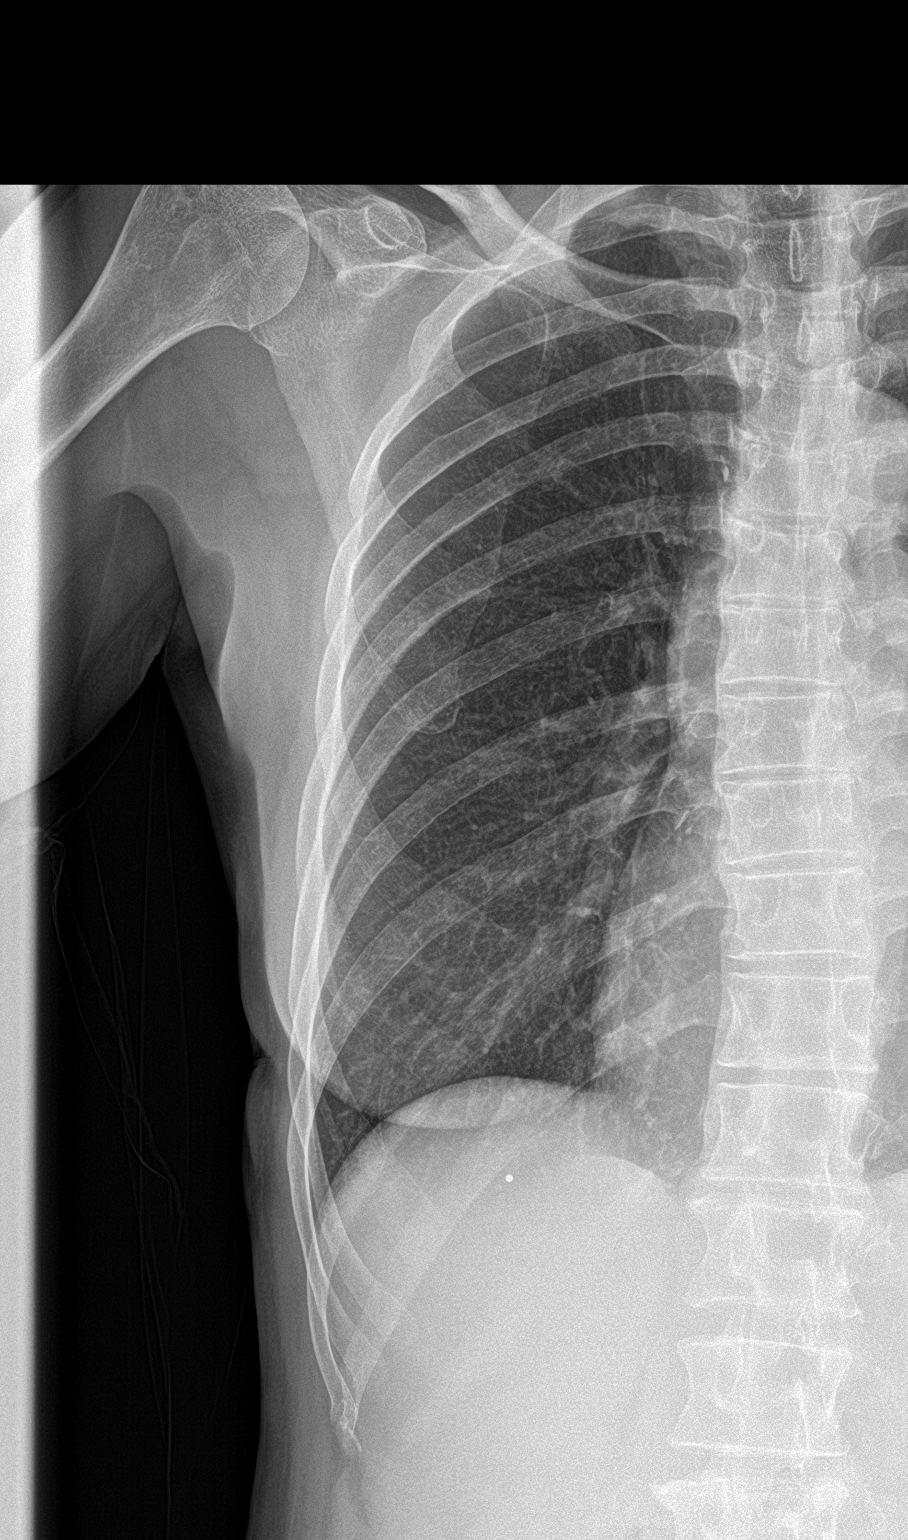
[im 3/3]
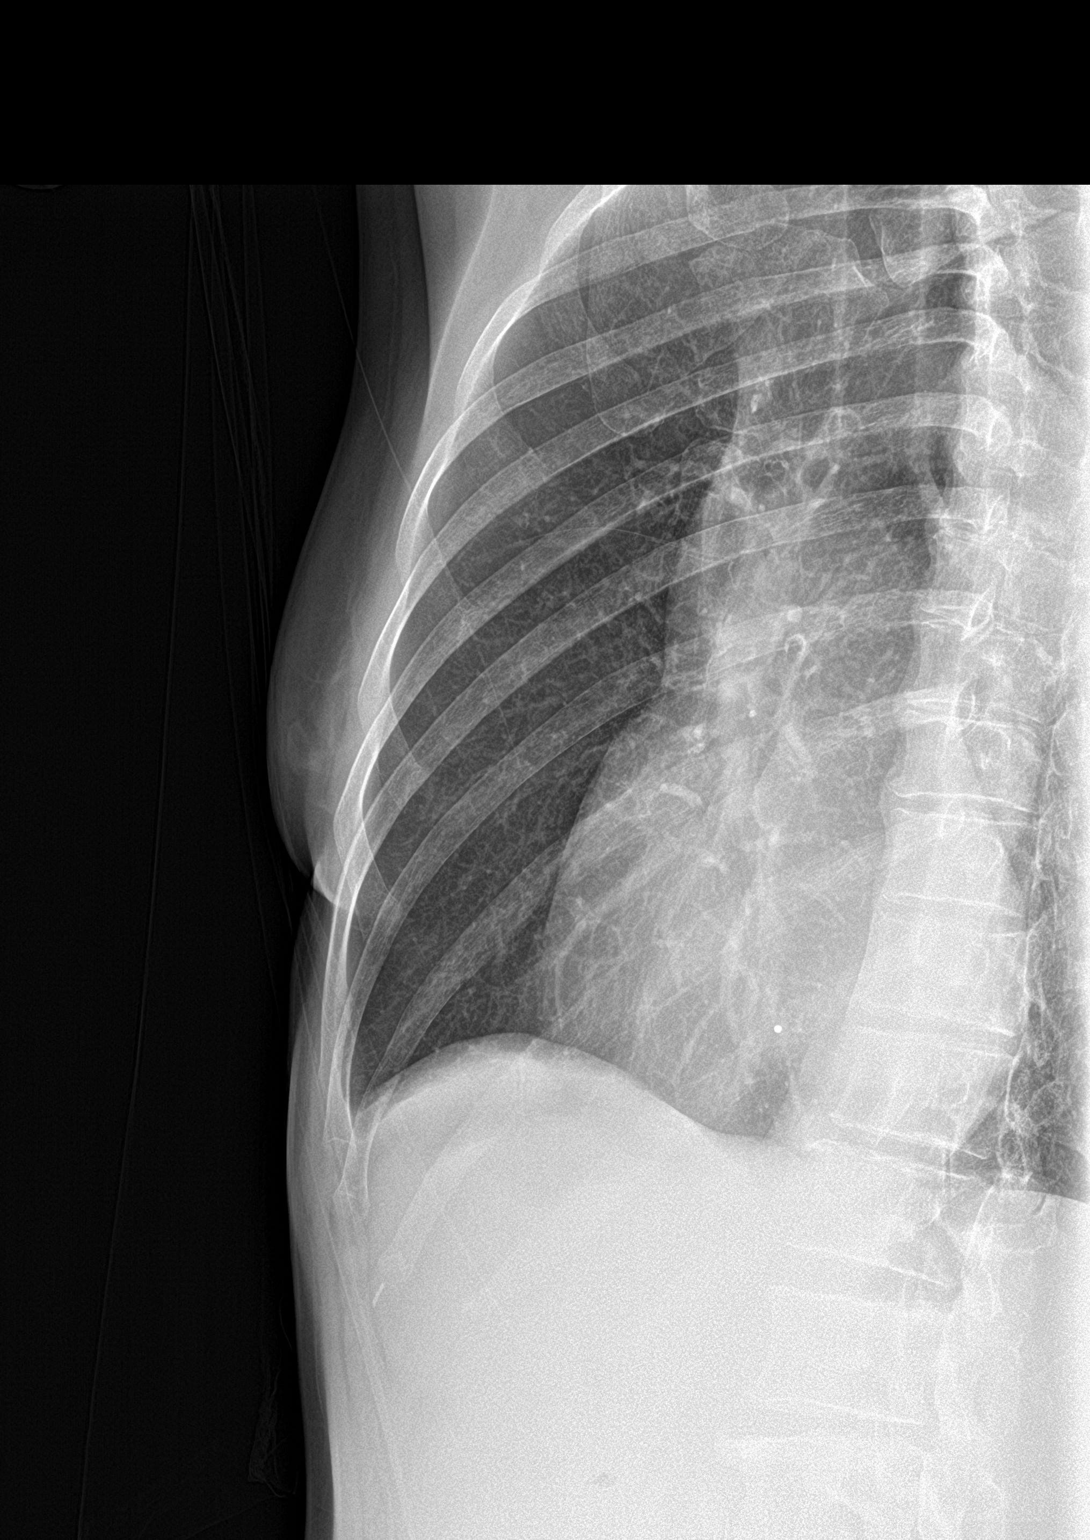

[3 of 3 positions shown; findings below may reference images not displayed]

FINDINGS: No fracture or other bone lesions are seen involving the ribs. There
is no evidence of pneumothorax or pleural effusion. Both lungs are
clear. Heart size and mediastinal contours are within normal limits.
IMPRESSION: Negative.

## 2024-03-26 ENCOUNTER — Encounter: Payer: Self-pay | Admitting: *Deleted

## 2024-06-22 ENCOUNTER — Other Ambulatory Visit: Payer: Self-pay

## 2024-06-22 DIAGNOSIS — F17210 Nicotine dependence, cigarettes, uncomplicated: Secondary | ICD-10-CM

## 2024-06-22 DIAGNOSIS — Z87891 Personal history of nicotine dependence: Secondary | ICD-10-CM

## 2024-06-22 DIAGNOSIS — Z122 Encounter for screening for malignant neoplasm of respiratory organs: Secondary | ICD-10-CM

## 2024-06-23 ENCOUNTER — Telehealth: Payer: Self-pay | Admitting: Acute Care

## 2024-06-23 NOTE — Telephone Encounter (Signed)
 Lung Cancer Screening Narrative/Criteria Questionnaire (Cigarette Smokers Only- No Cigars/Pipes/vapes)   Cassandra Jennings   SDMV:06/29/24 at 1p/[facility]                                           [birth year]               LDCT: 07/06/24 at 10a/[facility]    63 y.o.   Phone: [phone]  Lung Screening Narrative (confirm age 20-77 yrs Medicare / 50-80 yrs Private pay insurance)   Insurance information:medicaid   Referring Provider:Alazzam   This screening involves an initial phone call with a team member from our program. It is called a shared decision making visit. The initial meeting is required by insurance and Medicare to make sure you understand the program. This appointment takes about 15-20 minutes to complete. The CT scan will completed at a separate date/time. This scan takes about 5-10 minutes to complete and you may eat and drink before and after the scan.  Criteria questions for Lung Cancer Screening:   Are you a current or former smoker? Current Age began smoking: 10y   If you are a former smoker, what year did you quit smoking? NA   To calculate your smoking history, I need an accurate estimate of how many packs of cigarettes you smoked per day and for how many years. (Not just the number of PPD you are now smoking)   Years smoking 53 x Packs per day 1 = Pack years 53   (at least 20 pack yrs)   (Make sure they understand that we need to know how much they have smoked in the past, not just the number of PPD they are smoking now)  Do you have a personal history of cancer?  No    Do you have a family history of cancer? Yes  (cancer type and and relative) mom/breast  Are you coughing up blood?  No  Have you had unexplained weight loss of 15 lbs or more in the last 6 months? No  It looks like you meet all criteria.     Additional information:NA

## 2024-06-29 ENCOUNTER — Ambulatory Visit: Payer: MEDICAID

## 2024-06-29 NOTE — Progress Notes (Unsigned)
 Attempted to reach pt x3 to do SDMV. No answer- left message to call office to reschedule.

## 2024-07-02 ENCOUNTER — Encounter: Payer: Self-pay | Admitting: Emergency Medicine

## 2024-07-02 NOTE — Progress Notes (Deleted)
 Referring Physician:  [facility], [facility] [address] [place],  [place] [address]  Primary Physician:  Ernie Yancy Roof, MD  History of Present Illness: 07/02/2024 Ms. Cassandra Jennings is here today with a chief complaint of ***  Cervical. Right upper extremity pain. Pain from neck to hands. Numbness to hands.  Duration: *** Location: *** Quality: *** Severity: ***  Precipitating: aggravated by *** Modifying factors: made better by *** Weakness: none Timing: *** Bowel/Bladder Dysfunction: none  Conservative measures:  Physical therapy: *** Has nor participated in. Multimodal medical therapy including regular antiinflammatories: *** meloxicam, prednisone , ibuprofen  Injections: *** No epidural steroid injections.  Past Surgery: ***  Jaryah E Blais has ***no symptoms of cervical myelopathy.  The symptoms are causing a significant impact on the patient's life.   Review of Systems:  A 10 point review of systems is negative, except for the pertinent positives and negatives detailed in the HPI.  Past Medical History: Past Medical History:  Diagnosis Date   Anxiety    COPD (chronic obstructive pulmonary disease) (HCC)    Hepatitis    Hypertension     Past Surgical History: Past Surgical History:  Procedure Laterality Date   DILATION AND CURETTAGE OF UTERUS     XI ROBOTIC LAPAROSCOPIC ASSISTED APPENDECTOMY N/A 03/17/2023   Procedure: XI ROBOTIC LAPAROSCOPIC ASSISTED APPENDECTOMY;  Surgeon: Rodolph Romano, MD;  Location: [facility] ORS;  Service: General;  Laterality: N/A;    Allergies: Allergies as of 07/12/2024 - Review Complete 09/17/2023  Allergen Reaction Noted   Penicillins Anaphylaxis 04/30/2015   Codeine  11/14/2021    Medications: Outpatient Encounter Medications as of 07/12/2024  Medication Sig   albuterol  (PROVENTIL  HFA;VENTOLIN  HFA) 108 (90 Base) MCG/ACT inhaler Inhale 2 puffs into the lungs every 6 (six) hours as needed for  wheezing or shortness of breath. (Patient taking differently: Inhale 2 puffs into the lungs every 4 (four) hours as needed for wheezing or shortness of breath.)   amphetamine-dextroamphetamine (ADDERALL XR) 10 MG 24 hr capsule Take 10 mg by mouth daily.   azithromycin  (ZITHROMAX ) 250 MG tablet Take 2 tablets PO on day 1, then take 1 tablet PO daily for 4 more days   cetirizine (ZYRTEC) 10 MG tablet Take 10 mg by mouth daily.   famotidine (PEPCID) 40 MG tablet Take 40 mg by mouth daily.   lidocaine  (LIDODERM ) 5 % Place 1 patch onto the skin daily. Remove & Discard patch within 12 hours or as directed by MD   lisinopril -hydrochlorothiazide (ZESTORETIC) 20-12.5 MG tablet Take 1 tablet by mouth daily.   meloxicam (MOBIC) 7.5 MG tablet Take 7.5 mg by mouth daily.   ondansetron  (ZOFRAN -ODT) 4 MG disintegrating tablet Take 1 tablet (4 mg total) by mouth every 8 (eight) hours as needed.   predniSONE  (DELTASONE ) 10 MG tablet Take 6 tablets  today, on day 2 take 5 tablets, day 3 take 4 tablets, day 4 take 3 tablets, day 5 take  2 tablets and 1 tablet the last day   umeclidinium-vilanterol (ANORO ELLIPTA) 62.5-25 MCG/ACT AEPB Inhale 1 puff into the lungs daily.   No facility-administered encounter medications on file as of 07/12/2024.    Social History: Social History   Tobacco Use   Smoking status: Every Day    Types: Cigarettes   Smokeless tobacco: Never  Vaping Use   Vaping status: Every Day  Substance Use Topics   Alcohol use: Yes    Alcohol/week: 3.0 standard drinks of alcohol    Types: 1 Glasses  of wine, 1 Cans of beer, 1 Shots of liquor per week   Drug use: Yes    Types: Marijuana    Family Medical History: Family History  Problem Relation Age of Onset   Breast cancer Mother 35 - 26   Breast cancer Maternal Great-grandmother     Physical Examination: @VITALWITHPAIN @  General: Patient is well developed, well nourished, calm, collected, and in no apparent distress. Attention to  examination is appropriate.  Psychiatric: Patient is non-anxious.  Head:  Pupils equal, round, and reactive to light.  ENT:  Oral mucosa appears well hydrated.  Neck:   Supple.  ***Full range of motion.  Respiratory: Patient is breathing without any difficulty.  Extremities: No edema.  Vascular: Palpable dorsal pedal pulses.  Skin:   On exposed skin, there are no abnormal skin lesions.  NEUROLOGICAL:     Awake, alert, oriented to person, place, and time.  Speech is clear and fluent. Fund of knowledge is appropriate.   Cranial Nerves: Pupils equal round and reactive to light.  Facial tone is symmetric.  Facial sensation is symmetric.  ROM of spine: ***full.  Palpation of spine: ***non tender.    Strength: Side Biceps Triceps Deltoid Interossei Grip Wrist Ext. Wrist Flex.  R 5 5 5 5 5 5 5   L 5 5 5 5 5 5 5    Side Iliopsoas Quads Hamstring PF DF EHL  R 5 5 5 5 5 5   L 5 5 5 5 5 5    Reflexes are ***2+ and symmetric at the biceps, triceps, brachioradialis, patella and achilles.   Hoffman's is absent.  Clonus is not present.  Toes are down-going.  Bilateral upper and lower extremity sensation is intact to light touch.    Gait is normal.   No difficulty with tandem gait.   No evidence of dysmetria noted.  Medical Decision Making  Imaging: ***  I have personally reviewed the images and agree with the above interpretation.  Assessment and Plan: Ms. Bai is a pleasant 63 y.o. female with ***    Thank you for involving me in the care of this patient.   I spent a total of *** minutes in both face-to-face and non-face-to-face activities for this visit on the date of this encounter.   Lyle Decamp, PA-C Dept. of Neurosurgery

## 2024-07-06 ENCOUNTER — Ambulatory Visit: Admission: RE | Admit: 2024-07-06 | Payer: MEDICAID

## 2024-07-12 ENCOUNTER — Ambulatory Visit: Payer: MEDICAID | Admitting: Physician Assistant

## 2024-08-06 ENCOUNTER — Encounter: Payer: Self-pay | Admitting: Physician Assistant
